# Patient Record
Sex: Male | Born: 1965 | Race: White | Hispanic: No | Marital: Single | Smoking: Never smoker
Health system: Southern US, Community
[De-identification: ages and names within clinical notes are randomized; demographics above are authoritative.]

## PROBLEM LIST (undated history)

## (undated) DIAGNOSIS — K219 Gastro-esophageal reflux disease without esophagitis: Secondary | ICD-10-CM

## (undated) DIAGNOSIS — Z87442 Personal history of urinary calculi: Secondary | ICD-10-CM

## (undated) DIAGNOSIS — M1990 Unspecified osteoarthritis, unspecified site: Secondary | ICD-10-CM

---

## 2002-01-21 HISTORY — PX: ACHILLES TENDON SURGERY: SHX542

## 2009-01-21 HISTORY — PX: CYST EXCISION: SHX5701

## 2016-02-16 ENCOUNTER — Ambulatory Visit: Payer: Self-pay | Admitting: Physician Assistant

## 2016-02-27 ENCOUNTER — Encounter (HOSPITAL_COMMUNITY)
Admission: RE | Admit: 2016-02-27 | Discharge: 2016-02-27 | Disposition: A | Discharge status: Home / Self Care | Payer: BLUE CROSS/BLUE SHIELD | Source: Ambulatory Visit | Attending: Orthopedic Surgery | Admitting: Orthopedic Surgery

## 2016-02-27 ENCOUNTER — Encounter (HOSPITAL_COMMUNITY): Payer: Self-pay

## 2016-02-27 DIAGNOSIS — G9589 Other specified diseases of spinal cord: Secondary | ICD-10-CM | POA: Insufficient documentation

## 2016-02-27 DIAGNOSIS — Z01812 Encounter for preprocedural laboratory examination: Secondary | ICD-10-CM | POA: Insufficient documentation

## 2016-02-27 HISTORY — DX: Personal history of urinary calculi: Z87.442

## 2016-02-27 HISTORY — DX: Gastro-esophageal reflux disease without esophagitis: K21.9

## 2016-02-27 HISTORY — DX: Unspecified osteoarthritis, unspecified site: M19.90

## 2016-02-27 LAB — BASIC METABOLIC PANEL
Anion gap: 8 (ref 5–15)
BUN: 13 mg/dL (ref 6–20)
CO2: 28 mmol/L (ref 22–32)
CREATININE: 0.84 mg/dL (ref 0.61–1.24)
Calcium: 9.5 mg/dL (ref 8.9–10.3)
Chloride: 103 mmol/L (ref 101–111)
GFR calc Af Amer: >60 mL/min (ref >60)
Glucose, Bld: 98 mg/dL (ref 65–99)
Potassium: 3.8 mmol/L (ref 3.5–5.1)
SODIUM: 139 mmol/L (ref 135–145)

## 2016-02-27 LAB — TYPE AND SCREEN
ABO/RH(D): O POS
ANTIBODY SCREEN: NEGATIVE

## 2016-02-27 LAB — CBC
HCT: 48.1 % (ref 39.0–52.0)
Hemoglobin: 16.8 g/dL (ref 13.0–17.0)
MCH: 32.2 pg (ref 26.0–34.0)
MCHC: 34.9 g/dL (ref 30.0–36.0)
MCV: 92.1 fL (ref 78.0–100.0)
PLATELETS: 197 10*3/uL (ref 150–400)
RBC: 5.22 MIL/uL (ref 4.22–5.81)
RDW: 13.3 % (ref 11.5–15.5)
WBC: 8.6 10*3/uL (ref 4.0–10.5)

## 2016-02-27 LAB — ABO/RH: ABO/RH(D): O POS

## 2016-02-27 LAB — SURGICAL PCR SCREEN
MRSA, PCR: NEGATIVE
STAPHYLOCOCCUS AUREUS: NEGATIVE

## 2016-02-27 NOTE — Progress Notes (Addendum)
PCP: Dr. Joline Salt in [place], [place] [phone]  Cardiology: [facility] @ [address] -Riesa Pope --will request ekg/notes/echo (pt. Stated he had to get clearance per Dr. Shon Baton)  Clearance in chart.  Pt. Reports he takes hctz  To prevent kidney stones.

## 2016-02-27 NOTE — Pre-Procedure Instructions (Addendum)
    Rickey Stafford  02/27/2016      [facility] - [place], [place] - [address] [place] [address] Phone: [phone] Fax: [phone]    Your procedure is scheduled on Wed. Feb. 14  Report to [facility] Admitting at 11:15 A.M.  Call this number if you have problems the morning of surgery:  [phone]   Remember:  Do not eat food or drink liquids after midnight on Tues. Feb. 13   Take these medicines the morning of surgery with A SIP OF WATER : tylenol if needed, ranitidine              1 week prior to surgery stop: advil, motrin, ibuprofen,aleve(naproxen sodium),  Mobic, vitamins/herbal medicines.   Do not wear jewelry.  Do not wear lotions, powders, or perfumes, or deoderant.  Do not shave 48 hours prior to surgery.  Men may shave face and neck.  Do not bring valuables to the hospital.  [facility] is not responsible for any belongings or valuables.  Contacts, dentures or bridgework may not be worn into surgery.  Leave your suitcase in the car.  After surgery it may be brought to your room.  For patients admitted to the hospital, discharge time will be determined by your treatment team.  Patients discharged the day of surgery will not be allowed to drive home.    Special instructions:  Review preparing for surgery  Please read over the following fact sheets that you were given. Coughing and Deep Breathing and MRSA Information

## 2016-02-28 NOTE — Progress Notes (Signed)
Anesthesia Chart Review:  Pt is a 51 year old male scheduled for T11-12 decompression and fusion on 03/06/2016 with Venita Lick, MD  - PCP is Joline Salt, MD - Pt saw Arnette Felts, PA, at [facility] for pre-op cardiology eval. Echo results below. Pt cleared at last office visit 02/16/16.   PMH includes:  GERD. Never smoker. BMI 35.5  Medications include: hctz, zantac, sildenafil  Preoperative labs reviewed.    EKG: 02/03/16 [facility]): sinus rhythm with sinus arrhythmia.    Echo 02/16/16 [facility]):  1. LA mildly dilated. 2. Otherwise normal study. Normal cardiac chamber sizes and function. Normal valve anatomy and function.  If no changes, I anticipate pt can proceed with surgery as scheduled.   Rica Mast, FNP-BC [facility]/Anesthesiology Phone: [phone] 02/28/2016 11:13 AM

## 2016-03-06 ENCOUNTER — Encounter (HOSPITAL_COMMUNITY)
Admission: RE | Disposition: A | Discharge status: Home / Self Care | Payer: Self-pay | Source: Ambulatory Visit | Attending: Orthopedic Surgery

## 2016-03-06 ENCOUNTER — Inpatient Hospital Stay (HOSPITAL_COMMUNITY): Payer: BLUE CROSS/BLUE SHIELD

## 2016-03-06 ENCOUNTER — Inpatient Hospital Stay (HOSPITAL_COMMUNITY)
Admission: RE | Admit: 2016-03-06 | Discharge: 2016-03-07 | DRG: 460 | Disposition: A | Discharge status: Home / Self Care | Payer: BLUE CROSS/BLUE SHIELD | Source: Ambulatory Visit | Attending: Orthopedic Surgery | Admitting: Orthopedic Surgery

## 2016-03-06 ENCOUNTER — Inpatient Hospital Stay (HOSPITAL_COMMUNITY): Payer: BLUE CROSS/BLUE SHIELD | Admitting: Certified Registered Nurse Anesthetist

## 2016-03-06 ENCOUNTER — Encounter (HOSPITAL_COMMUNITY): Payer: Self-pay | Admitting: *Deleted

## 2016-03-06 ENCOUNTER — Inpatient Hospital Stay (HOSPITAL_COMMUNITY): Payer: BLUE CROSS/BLUE SHIELD | Admitting: Emergency Medicine

## 2016-03-06 DIAGNOSIS — K219 Gastro-esophageal reflux disease without esophagitis: Secondary | ICD-10-CM | POA: Diagnosis present

## 2016-03-06 DIAGNOSIS — Z791 Long term (current) use of non-steroidal anti-inflammatories (NSAID): Secondary | ICD-10-CM | POA: Diagnosis not present

## 2016-03-06 DIAGNOSIS — Z419 Encounter for procedure for purposes other than remedying health state, unspecified: Secondary | ICD-10-CM

## 2016-03-06 DIAGNOSIS — M5104 Intervertebral disc disorders with myelopathy, thoracic region: Secondary | ICD-10-CM | POA: Diagnosis present

## 2016-03-06 DIAGNOSIS — M5136 Other intervertebral disc degeneration, lumbar region: Secondary | ICD-10-CM | POA: Diagnosis present

## 2016-03-06 DIAGNOSIS — Z79899 Other long term (current) drug therapy: Secondary | ICD-10-CM

## 2016-03-06 DIAGNOSIS — M48062 Spinal stenosis, lumbar region with neurogenic claudication: Secondary | ICD-10-CM | POA: Diagnosis present

## 2016-03-06 DIAGNOSIS — M069 Rheumatoid arthritis, unspecified: Secondary | ICD-10-CM | POA: Diagnosis present

## 2016-03-06 DIAGNOSIS — G959 Disease of spinal cord, unspecified: Secondary | ICD-10-CM | POA: Diagnosis present

## 2016-03-06 HISTORY — PX: LUMBAR LAMINECTOMY/DECOMPRESSION MICRODISCECTOMY: SHX5026

## 2016-03-06 SURGERY — LUMBAR LAMINECTOMY/DECOMPRESSION MICRODISCECTOMY 1 LEVEL
Anesthesia: General

## 2016-03-06 MED ORDER — ACETAMINOPHEN 650 MG RE SUPP: 650.0000 mg | RECTAL | Status: DC | PRN

## 2016-03-06 MED ORDER — ACETAMINOPHEN 10 MG/ML IV SOLN
1000.0000 mg | Freq: Four times a day (QID) | INTRAVENOUS | Status: DC
  Administered 2016-03-06 – 2016-03-07 (×3): 1000 mg via INTRAVENOUS
  Filled 2016-03-06 (×2): qty 100

## 2016-03-06 MED ORDER — HYDROCHLOROTHIAZIDE 25 MG PO TABS
25.0000 mg | ORAL_TABLET | Freq: Every day | ORAL | Status: DC
  Administered 2016-03-07: 25 mg via ORAL
  Filled 2016-03-06 (×2): qty 1

## 2016-03-06 MED ORDER — FENTANYL CITRATE (PF) 100 MCG/2ML IJ SOLN
INTRAMUSCULAR | Status: AC
  Filled 2016-03-06: qty 2

## 2016-03-06 MED ORDER — FLEET ENEMA 7-19 GM/118ML RE ENEM: 1.0000 | ENEMA | Freq: Once | RECTAL | Status: DC

## 2016-03-06 MED ORDER — THROMBIN 20000 UNITS EX SOLR
CUTANEOUS | Status: DC | PRN
  Administered 2016-03-06: 20 mL via TOPICAL

## 2016-03-06 MED ORDER — METHOCARBAMOL 500 MG PO TABS
500.0000 mg | ORAL_TABLET | Freq: Four times a day (QID) | ORAL | Status: DC | PRN
  Administered 2016-03-06 – 2016-03-07 (×2): 500 mg via ORAL
  Filled 2016-03-06 (×2): qty 1

## 2016-03-06 MED ORDER — SUCCINYLCHOLINE CHLORIDE 20 MG/ML IJ SOLN
INTRAMUSCULAR | Status: DC | PRN
  Administered 2016-03-06: 120 mg via INTRAVENOUS

## 2016-03-06 MED ORDER — DEXAMETHASONE SODIUM PHOSPHATE 4 MG/ML IJ SOLN: 4.0000 mg | Freq: Four times a day (QID) | INTRAMUSCULAR | Status: AC

## 2016-03-06 MED ORDER — THROMBIN 20000 UNITS EX SOLR
CUTANEOUS | Status: AC
  Filled 2016-03-06: qty 20000

## 2016-03-06 MED ORDER — LIDOCAINE 2% (20 MG/ML) 5 ML SYRINGE
INTRAMUSCULAR | Status: AC
  Filled 2016-03-06: qty 5

## 2016-03-06 MED ORDER — PROPOFOL 1000 MG/100ML IV EMUL
INTRAVENOUS | Status: AC
  Filled 2016-03-06: qty 100

## 2016-03-06 MED ORDER — MAGNESIUM CITRATE PO SOLN
0.5000 | Freq: Once | ORAL | Status: AC
  Administered 2016-03-07: 0.5 via ORAL

## 2016-03-06 MED ORDER — FENTANYL CITRATE (PF) 100 MCG/2ML IJ SOLN
INTRAMUSCULAR | Status: AC
Start: 2016-03-06 — End: 2016-03-06
  Administered 2016-03-06: 50 ug via INTRAVENOUS
  Filled 2016-03-06: qty 2

## 2016-03-06 MED ORDER — OXYCODONE HCL 5 MG PO TABS
5.0000 mg | ORAL_TABLET | ORAL | Status: DC | PRN
Start: 2016-03-06 — End: 2016-03-07
  Administered 2016-03-07 (×3): 10 mg via ORAL
  Filled 2016-03-06 (×3): qty 2

## 2016-03-06 MED ORDER — PROPOFOL 10 MG/ML IV BOLUS
INTRAVENOUS | Status: AC
  Filled 2016-03-06: qty 20

## 2016-03-06 MED ORDER — ONDANSETRON HCL 4 MG/2ML IJ SOLN: 4.0000 mg | Freq: Once | INTRAMUSCULAR | Status: DC | PRN

## 2016-03-06 MED ORDER — OXYCODONE-ACETAMINOPHEN 5-325 MG PO TABS
1.0000 | ORAL_TABLET | ORAL | Status: DC | PRN
  Administered 2016-03-06: 2 via ORAL
  Filled 2016-03-06: qty 2

## 2016-03-06 MED ORDER — OXYCODONE HCL 5 MG PO TABS
5.0000 mg | ORAL_TABLET | Freq: Once | ORAL | Status: AC | PRN
  Administered 2016-03-06: 5 mg via ORAL

## 2016-03-06 MED ORDER — POLYETHYLENE GLYCOL 3350 17 G PO PACK: 17.0000 g | PACK | Freq: Every day | ORAL | Status: DC | PRN

## 2016-03-06 MED ORDER — LACTATED RINGERS IV SOLN: INTRAVENOUS | Status: DC

## 2016-03-06 MED ORDER — DEXAMETHASONE 4 MG PO TABS
4.0000 mg | ORAL_TABLET | Freq: Four times a day (QID) | ORAL | Status: AC
  Administered 2016-03-06 – 2016-03-07 (×3): 4 mg via ORAL
  Filled 2016-03-06 (×3): qty 1

## 2016-03-06 MED ORDER — BUPIVACAINE-EPINEPHRINE 0.25% -1:200000 IJ SOLN
INTRAMUSCULAR | Status: DC | PRN
  Administered 2016-03-06: 6 mL

## 2016-03-06 MED ORDER — PROPOFOL 10 MG/ML IV BOLUS
INTRAVENOUS | Status: DC | PRN
  Administered 2016-03-06: 180 mg via INTRAVENOUS

## 2016-03-06 MED ORDER — OXYCODONE HCL 5 MG PO TABS
ORAL_TABLET | ORAL | Status: AC
  Administered 2016-03-06: 5 mg via ORAL
  Filled 2016-03-06: qty 1

## 2016-03-06 MED ORDER — MIDAZOLAM HCL 2 MG/2ML IJ SOLN
INTRAMUSCULAR | Status: AC
  Filled 2016-03-06: qty 2

## 2016-03-06 MED ORDER — ONDANSETRON HCL 4 MG/2ML IJ SOLN: 4.0000 mg | INTRAMUSCULAR | Status: DC | PRN

## 2016-03-06 MED ORDER — ONDANSETRON HCL 4 MG/2ML IJ SOLN
INTRAMUSCULAR | Status: DC | PRN
  Administered 2016-03-06: 4 mg via INTRAVENOUS

## 2016-03-06 MED ORDER — ACETAMINOPHEN 10 MG/ML IV SOLN
INTRAVENOUS | Status: AC
  Administered 2016-03-06: 1000 mg via INTRAVENOUS
  Filled 2016-03-06: qty 100

## 2016-03-06 MED ORDER — LACTATED RINGERS IV SOLN
INTRAVENOUS | Status: DC
  Administered 2016-03-06 (×3): via INTRAVENOUS

## 2016-03-06 MED ORDER — MENTHOL 3 MG MT LOZG: 1.0000 | LOZENGE | OROMUCOSAL | Status: DC | PRN

## 2016-03-06 MED ORDER — CEFAZOLIN SODIUM-DEXTROSE 2-4 GM/100ML-% IV SOLN
2.0000 g | Freq: Three times a day (TID) | INTRAVENOUS | Status: AC
  Administered 2016-03-06 – 2016-03-07 (×2): 2 g via INTRAVENOUS
  Filled 2016-03-06 (×2): qty 100

## 2016-03-06 MED ORDER — MIDAZOLAM HCL 2 MG/2ML IJ SOLN
INTRAMUSCULAR | Status: DC | PRN
  Administered 2016-03-06: 2 mg via INTRAVENOUS

## 2016-03-06 MED ORDER — MORPHINE SULFATE (PF) 4 MG/ML IV SOLN: 2.0000 mg | INTRAVENOUS | Status: DC | PRN

## 2016-03-06 MED ORDER — ACETAMINOPHEN 325 MG PO TABS: 650.0000 mg | ORAL_TABLET | ORAL | Status: DC | PRN

## 2016-03-06 MED ORDER — SODIUM CHLORIDE 0.9% FLUSH: 3.0000 mL | Freq: Two times a day (BID) | INTRAVENOUS | Status: DC

## 2016-03-06 MED ORDER — PROPOFOL 500 MG/50ML IV EMUL
INTRAVENOUS | Status: DC | PRN
  Administered 2016-03-06: 100 ug/kg/min via INTRAVENOUS

## 2016-03-06 MED ORDER — SODIUM CHLORIDE 0.9% FLUSH: 3.0000 mL | INTRAVENOUS | Status: DC | PRN

## 2016-03-06 MED ORDER — FENTANYL CITRATE (PF) 100 MCG/2ML IJ SOLN
25.0000 ug | INTRAMUSCULAR | Status: DC | PRN
  Administered 2016-03-06 (×2): 50 ug via INTRAVENOUS

## 2016-03-06 MED ORDER — 0.9 % SODIUM CHLORIDE (POUR BTL) OPTIME
TOPICAL | Status: DC | PRN
  Administered 2016-03-06: 1000 mL

## 2016-03-06 MED ORDER — PHENOL 1.4 % MT LIQD: 1.0000 | OROMUCOSAL | Status: DC | PRN

## 2016-03-06 MED ORDER — CEFAZOLIN SODIUM-DEXTROSE 2-4 GM/100ML-% IV SOLN
2.0000 g | INTRAVENOUS | Status: AC
  Administered 2016-03-06: 2 g via INTRAVENOUS
  Filled 2016-03-06: qty 100

## 2016-03-06 MED ORDER — LIDOCAINE 2% (20 MG/ML) 5 ML SYRINGE
INTRAMUSCULAR | Status: DC | PRN
  Administered 2016-03-06: 40 mg via INTRAVENOUS

## 2016-03-06 MED ORDER — EPINEPHRINE PF 1 MG/ML IJ SOLN
INTRAMUSCULAR | Status: AC
  Filled 2016-03-06: qty 1

## 2016-03-06 MED ORDER — MAGNESIUM CITRATE PO SOLN
0.5000 | Freq: Once | ORAL | Status: DC | PRN
  Filled 2016-03-06: qty 296

## 2016-03-06 MED ORDER — BUPIVACAINE HCL (PF) 0.25 % IJ SOLN
INTRAMUSCULAR | Status: AC
  Filled 2016-03-06: qty 30

## 2016-03-06 MED ORDER — SUCCINYLCHOLINE CHLORIDE 200 MG/10ML IV SOSY
PREFILLED_SYRINGE | INTRAVENOUS | Status: AC
  Filled 2016-03-06: qty 10

## 2016-03-06 MED ORDER — FENTANYL CITRATE (PF) 100 MCG/2ML IJ SOLN
INTRAMUSCULAR | Status: DC | PRN
  Administered 2016-03-06: 50 ug via INTRAVENOUS
  Administered 2016-03-06 (×2): 100 ug via INTRAVENOUS
  Administered 2016-03-06: 50 ug via INTRAVENOUS
  Administered 2016-03-06: 100 ug via INTRAVENOUS

## 2016-03-06 MED ORDER — OXYCODONE HCL 5 MG/5ML PO SOLN: 5.0000 mg | Freq: Once | ORAL | Status: AC | PRN

## 2016-03-06 SURGICAL SUPPLY — 79 items
BUR EGG ELITE 4.0 (BURR) IMPLANT
BUR EGG ELITE 4.0MM (BURR)
BUR MATCHSTICK NEURO 3.0 LAGG (BURR) ×3 IMPLANT
BUR ROUND PRECISION 4.0 (BURR) ×2 IMPLANT
BUR ROUND PRECISION 4.0MM (BURR) ×1
CANISTER SUCTION 2500CC (MISCELLANEOUS) ×3 IMPLANT
CLOSURE STERI-STRIP 1/2X4 (GAUZE/BANDAGES/DRESSINGS) ×1
CLOSURE WOUND 1/2 X4 (GAUZE/BANDAGES/DRESSINGS) ×1
CLSR STERI-STRIP ANTIMIC 1/2X4 (GAUZE/BANDAGES/DRESSINGS) ×2 IMPLANT
CORDS BIPOLAR (ELECTRODE) ×3 IMPLANT
COVER SURGICAL LIGHT HANDLE (MISCELLANEOUS) ×3 IMPLANT
DRAIN CHANNEL 15F RND FF W/TCR (WOUND CARE) IMPLANT
DRAPE POUCH INSTRU U-SHP 10X18 (DRAPES) ×3 IMPLANT
DRAPE SURG 17X23 STRL (DRAPES) ×3 IMPLANT
DRAPE U-SHAPE 47X51 STRL (DRAPES) ×3 IMPLANT
DRESSING AQUACEL AG SP 3.5X6 (GAUZE/BANDAGES/DRESSINGS) ×1 IMPLANT
DRSG AQUACEL AG ADV 3.5X 4 (GAUZE/BANDAGES/DRESSINGS) ×3 IMPLANT
DRSG AQUACEL AG ADV 3.5X 6 (GAUZE/BANDAGES/DRESSINGS) ×3 IMPLANT
DRSG AQUACEL AG SP 3.5X6 (GAUZE/BANDAGES/DRESSINGS) ×3
DURAPREP 26ML APPLICATOR (WOUND CARE) ×3 IMPLANT
ELECT BLADE 4.0 EZ CLEAN MEGAD (MISCELLANEOUS)
ELECT CAUTERY BLADE 6.4 (BLADE) ×3 IMPLANT
ELECT PENCIL ROCKER SW 15FT (MISCELLANEOUS) ×3 IMPLANT
ELECT REM PT RETURN 9FT ADLT (ELECTROSURGICAL) ×3
ELECTRODE BLDE 4.0 EZ CLN MEGD (MISCELLANEOUS) IMPLANT
ELECTRODE REM PT RTRN 9FT ADLT (ELECTROSURGICAL) ×1 IMPLANT
EVACUATOR SILICONE 100CC (DRAIN) IMPLANT
FEE INTRAOP MONITOR IMPULS NCS (MISCELLANEOUS) ×1 IMPLANT
GLOVE BIO SURGEON STRL SZ 6.5 (GLOVE) ×2 IMPLANT
GLOVE BIO SURGEONS STRL SZ 6.5 (GLOVE) ×1
GLOVE BIOGEL PI IND STRL 6.5 (GLOVE) ×1 IMPLANT
GLOVE BIOGEL PI IND STRL 8.5 (GLOVE) ×1 IMPLANT
GLOVE BIOGEL PI INDICATOR 6.5 (GLOVE) ×2
GLOVE BIOGEL PI INDICATOR 8.5 (GLOVE) ×2
GLOVE SS BIOGEL STRL SZ 8.5 (GLOVE) ×1 IMPLANT
GLOVE SUPERSENSE BIOGEL SZ 8.5 (GLOVE) ×2
GOWN STRL REUS W/TWL 2XL LVL3 (GOWN DISPOSABLE) ×6 IMPLANT
INTRAOP MONITOR FEE IMPULS NCS (MISCELLANEOUS) ×1
INTRAOP MONITOR FEE IMPULSE (MISCELLANEOUS) ×2
KIT BASIN OR (CUSTOM PROCEDURE TRAY) ×3 IMPLANT
KIT ROOM TURNOVER OR (KITS) ×3 IMPLANT
NEEDLE 22X1 1/2 (OR ONLY) (NEEDLE) ×3 IMPLANT
NEEDLE SPNL 18GX3.5 QUINCKE PK (NEEDLE) ×6 IMPLANT
NS IRRIG 1000ML POUR BTL (IV SOLUTION) ×3 IMPLANT
PACK LAMINECTOMY ORTHO (CUSTOM PROCEDURE TRAY) ×3 IMPLANT
PACK UNIVERSAL I (CUSTOM PROCEDURE TRAY) ×3 IMPLANT
PAD ARMBOARD 7.5X6 YLW CONV (MISCELLANEOUS) ×6 IMPLANT
PATTIES SURGICAL .5 X.5 (GAUZE/BANDAGES/DRESSINGS) IMPLANT
PATTIES SURGICAL .5 X1 (DISPOSABLE) ×3 IMPLANT
PROBE PEDCLE PROBE MAGSTM DISP (MISCELLANEOUS) ×3 IMPLANT
PUTTY BONE DBX 2.5 MIS (Bone Implant) ×3 IMPLANT
REDUCTION EXT RELINE MAS MOD (Neuro Prosthesis/Implant) ×12 IMPLANT
ROD RELINE MAS LORD 5.5X45MM (Rod) ×6 IMPLANT
SCREW LOCK RELINE 5.5 TULIP (Screw) ×12 IMPLANT
SCREW SHANK RELINE MOD 5.5X45 (Screw) ×12 IMPLANT
SPONGE SURGIFOAM ABS GEL 100 (HEMOSTASIS) IMPLANT
STRIP CLOSURE SKIN 1/2X4 (GAUZE/BANDAGES/DRESSINGS) ×2 IMPLANT
SURGIFLO W/THROMBIN 8M KIT (HEMOSTASIS) ×3 IMPLANT
SUT BONE WAX W31G (SUTURE) ×3 IMPLANT
SUT MON AB 3-0 SH 27 (SUTURE) ×2
SUT MON AB 3-0 SH27 (SUTURE) ×1 IMPLANT
SUT STRATAFIX 1PDS 45CM VIOLET (SUTURE) IMPLANT
SUT STRATAFIX MNCRL+ 3-0 PS-2 (SUTURE)
SUT STRATAFIX MONOCRYL 3-0 (SUTURE)
SUT STRATAFIX SPIRAL + 2-0 (SUTURE) IMPLANT
SUT VIC AB 0 CT1 27 (SUTURE)
SUT VIC AB 0 CT1 27XBRD ANBCTR (SUTURE) IMPLANT
SUT VIC AB 1 CT1 18XCR BRD 8 (SUTURE) ×1 IMPLANT
SUT VIC AB 1 CT1 8-18 (SUTURE) ×2
SUT VIC AB 1 CTX 36 (SUTURE) ×4
SUT VIC AB 1 CTX36XBRD ANBCTR (SUTURE) ×2 IMPLANT
SUT VIC AB 2-0 CT1 18 (SUTURE) ×3 IMPLANT
SUTURE STRATFX MNCRL+ 3-0 PS-2 (SUTURE) IMPLANT
SYR BULB IRRIGATION 50ML (SYRINGE) ×3 IMPLANT
SYR CONTROL 10ML LL (SYRINGE) ×3 IMPLANT
TOWEL OR 17X24 6PK STRL BLUE (TOWEL DISPOSABLE) ×3 IMPLANT
TOWEL OR 17X26 10 PK STRL BLUE (TOWEL DISPOSABLE) ×3 IMPLANT
WATER STERILE IRR 1000ML POUR (IV SOLUTION) ×3 IMPLANT
YANKAUER SUCT BULB TIP NO VENT (SUCTIONS) ×3 IMPLANT

## 2016-03-06 NOTE — Transfer of Care (Signed)
Immediate Anesthesia Transfer of Care Note  Patient: Elver Stadler  Procedure(s) Performed: Procedure(s) with comments: T11-12 Decompression and fusion (N/A) - Requests 4 hours  Patient Location: PACU  Anesthesia Type:General  Level of Consciousness: awake, alert  and patient cooperative  Airway & Oxygen Therapy: Patient Spontanous Breathing and Patient connected to nasal cannula oxygen  Post-op Assessment: Report given to RN, Post -op Vital signs reviewed and stable and Patient moving all extremities  Post vital signs: Reviewed and stable  Last Vitals:  Vitals:   03/06/16 1226 03/06/16 1625  BP: 109/64 115/61  Pulse: 69 72  Resp: 20 15  Temp: 37.2 C 36.8 C    Last Pain:  Vitals:   03/06/16 1226  TempSrc: Oral         Complications: No apparent anesthesia complications

## 2016-03-06 NOTE — Anesthesia Postprocedure Evaluation (Addendum)
Anesthesia Post Note  Patient: Rickey Stafford  Procedure(s) Performed: Procedure(s) (LRB): T11-12 Decompression and fusion (N/A)  Patient location during evaluation: PACU Anesthesia Type: General Level of consciousness: awake, awake and alert and oriented Pain management: pain level controlled Vital Signs Assessment: post-procedure vital signs reviewed and stable Respiratory status: spontaneous breathing, nonlabored ventilation and respiratory function stable Cardiovascular status: blood pressure returned to baseline Anesthetic complications: no       Last Vitals:  Vitals:   03/06/16 1726 03/06/16 1730  BP: 119/83   Pulse:  62  Resp:  15  Temp:      Last Pain:  Vitals:   03/06/16 1730  TempSrc:   PainSc: 6                  Rickey Stafford

## 2016-03-06 NOTE — Brief Op Note (Signed)
03/06/2016  4:00 PM  PATIENT:  Rickey Stafford  51 y.o. 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  PROCEDURE:  Procedure(s) with comments: T11-12 Decompression and fusion (N/A) - Requests 4 hours  SURGEON:  Surgeon(s) and Role:    * Venita Lick, MD - Primary  PHYSICIAN ASSISTANT:   ASSISTANTS: Carmen Mayo   ANESTHESIA:   general  EBL:  Total I/O In: 2000 [I.V.:2000] Out: 495 [Urine:195; Blood:300]  BLOOD ADMINISTERED:none  DRAINS: none   LOCAL MEDICATIONS USED:  MARCAINE     SPECIMEN:  No Specimen  DISPOSITION OF SPECIMEN:  N/A  COUNTS:  YES  TOURNIQUET:  * No tourniquets in log *  DICTATION: .Other Dictation: Dictation Number [id number]  PLAN OF CARE: Admit to inpatient   PATIENT DISPOSITION:  PACU - hemodynamically stable.

## 2016-03-06 NOTE — Anesthesia Preprocedure Evaluation (Addendum)

## 2016-03-06 NOTE — H&P (Signed)
History of Present Illness  The patient is a 50 year old male who presents for a follow-up for H & P. The patient is scheduled for a T11-12 decompression and fusion to be performed by Dr. Debria Garret D. Shon Baton, MD at [facility] on 03-06-16 . Please see the hospital record for complete dictated history and physical. Pt reports a hx of good health.  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 Diabetes Mellitus  Paternal Grandfather. Hypertension  Maternal Grandmother. Osteoarthritis  Mother. Rheumatoid Arthritis  Mother. Severe allergy  Mother.  Social History  Tobacco use  Never smoker. 11/20/2015: uses 1 1/2 can(s) smokeless per week Tobacco / smoke exposure  11/20/2015: no Children  0 Current work status  working full time Exercise  Exercises weekly; does running / walking Living situation  live alone Marital status  single Never consumed alcohol  11/20/2015: Never consumed alcohol No history of drug/alcohol rehab  Not under pain contract  Number of flights of stairs before winded  4-5  Medication History HydroCHLOROthiazide (25MG  Tablet, Oral) Active. RaNITidine HCl (300MG  Tablet, Oral) Active. Adderall XR (20MG  Capsule ER 24HR, Oral) Active. Meloxicam (15MG  Tablet, Oral) Active. Medications Reconciled  Past Surgical History Tonsillectomy   Other Problems  Gastroesophageal Reflux Disease  Rheumatoid Arthritis  Kidney Stone   Vitals  02/27/2016 9:32 AM Weight: 239 lb Height: 69in Weight was reported by patient. Height was reported by patient. Body Surface Area: 2.23 m Body Mass  Index: 35.29 kg/m  Temp.: 98.21F(Oral)  Pulse: 86 (Regular)  BP: 142/85 (Sitting, Left Arm, Standard)  General General Appearance-Not in acute distress. Orientation-Oriented X3. Build & Nutrition-Well nourished and Well developed.  Integumentary General Characteristics Surgical Scars - no surgical scar evidence of previous lumbar surgery. Lumbar Spine-Skin examination of the lumbar spine is without deformity, skin lesions, lacerations or abrasions.  Chest and Lung Exam Auscultation Breath sounds - Normal and Clear.  Cardiovascular Auscultation Rhythm - Regular rate and rhythm.  Abdomen Palpation/Percussion Palpation and Percussion of the abdomen reveal - Soft, Non Tender and No Rebound tenderness.  Peripheral Vascular Lower Extremity Palpation - Posterior tibial pulse - Bilateral - 2+. Dorsalis pedis pulse - Bilateral - 2+.  Neurologic Sensation Lower Extremity - Left - sensation is diminished in the lower extremity. Right - sensation is intact in the lower extremity. Reflexes Patellar Reflex - Bilateral - 2+. Achilles Reflex - Bilateral - 2+. Clonus - Bilateral - clonus not present. Hoffman's Sign - Bilateral - Hoffman's sign not present. Testing Seated Straight Leg Raise - Bilateral - Seated straight leg raise negative. Note: Unequivocal Babinski, No clonus   Musculoskeletal Spine/Ribs/Pelvis  Lumbosacral Spine: Inspection and Palpation - Tenderness - no soft tissue tenderness to palpation and no bony tenderness to palpation, bony and soft tissue palpation of the lumbar spine and SI joint does not recreate their typical pain. Strength and Tone: Strength - Hip Flexion - Bilateral - 5/5. Knee Extension - Bilateral - 5/5. Knee Flexion - Bilateral - 5/5. Ankle Dorsiflexion - Bilateral - 5/5. Ankle Plantarflexion - Bilateral - 5/5. Heel walk - Bilateral - able to heel walk without difficulty. Toe Walk - Bilateral - able to walk on toes without difficulty.  Heel-Toe Walk - Bilateral - able  to heel-toe walk without difficulty. ROM - Flexion - full range of motion. Extension - full range of motion. Left Lateral Bending - full range of motion. Right Lateral Bending - full range of motion. Right Rotation - full range of motion. Left Rotation - full range of motion. Pain - neither flexion or extension is more painful than the other. Lumbosacral Spine - Waddell's Signs - no Waddell's signs present. Lower Extremity Range of Motion - No true hip, knee or ankle pain with range of motion. Gait and Station - [employer] - no assistive devices.  His MRI shows myelopathic findings with cord signal change at T11-T12. I do agree with the natural history of myelopathy as one of progression  Assessment & Plan Goal Of Surgery: Discussed that goal of surgery is to reduce pain and improve function and quality of life. Patient is aware that despite all appropriate treatment that there pain and function could be the same, worse, or different.  Risks: infection, bleeding, nerve damage, death, stroke, paralysis, non-union, need for additional surgery, ongoing or worse pain/impairment, hardware failure, blood clots.  At this point, his MRI clearly shows myelopathic findings with cord signal change at T11-12. I do agree that the natural history of myelopathy is one of progression. In order to avoid this, I have recommended that we move forward with a T11-12 posterior decompression and then an instrumented fusion most likely from T11 to T12, but perhaps T10 to T12 depending upon the extent of the decompression that is required. I do not favor doing a rib resection and going anterior to remove that disc fragment. I think by doing a straight extensive posterior decompression this will allow enough room for the spinal cord that the myelopathy will not progress. The stabilization will also minimize ongoing degenerative changes as it will fuse that level. I have reviewed the risks  with him, which include infection, bleeding, nerve damage, death, stroke, paralysis, ongoing or worse pain, leak of spinal fluid, and need for additional surgery. All of his questions were addressed. We will plan on getting preoperative medical clearance from his primary care physician, Dr. Joline Salt in [facility].  .

## 2016-03-06 NOTE — Anesthesia Procedure Notes (Signed)
Procedure Name: Intubation Date/Time: 03/06/2016 12:44 PM Performed by: Julieta Bellini Pre-anesthesia Checklist: Patient identified, Emergency Drugs available, Patient being monitored, Suction available and Timeout performed Patient Re-evaluated:Patient Re-evaluated prior to inductionOxygen Delivery Method: Circle system utilized Preoxygenation: Pre-oxygenation with 100% oxygen Intubation Type: IV induction Ventilation: Mask ventilation without difficulty Laryngoscope Size: Mac and 3 Grade View: Grade I Tube type: Oral Number of attempts: 1 Airway Equipment and Method: Stylet Placement Confirmation: ETT inserted through vocal cords under direct vision,  positive ETCO2 and breath sounds checked- equal and bilateral Secured at: 22 cm Tube secured with: Tape Dental Injury: Teeth and Oropharynx as per pre-operative assessment

## 2016-03-07 ENCOUNTER — Encounter (HOSPITAL_COMMUNITY): Payer: Self-pay | Admitting: Orthopedic Surgery

## 2016-03-07 MED ORDER — ONDANSETRON 4 MG PO TBDP: 4.0000 mg | ORAL_TABLET | Freq: Three times a day (TID) | ORAL | 0 refills | Status: DC | PRN

## 2016-03-07 MED ORDER — OXYCODONE-ACETAMINOPHEN 10-325 MG PO TABS: 1.0000 | ORAL_TABLET | ORAL | 0 refills | Status: DC | PRN

## 2016-03-07 MED ORDER — METHOCARBAMOL 500 MG PO TABS: 500.0000 mg | ORAL_TABLET | Freq: Four times a day (QID) | ORAL | 0 refills | Status: DC | PRN

## 2016-03-07 MED ORDER — LUBIPROSTONE 24 MCG PO CAPS: 24.0000 ug | ORAL_CAPSULE | Freq: Two times a day (BID) | ORAL | 0 refills | Status: DC

## 2016-03-07 NOTE — Progress Notes (Signed)
Pt doing well. Pt and sister given D/C instructions with Rx's, verbal understanding was provided. Pt's incision is clean and dry with no sign of infection. Pt's IV was removed prior to D/C. Pt received 3-n-1 from [facility] prior to D/C. Pt D/C'd home via wheelchair with TLSO @ 1205 per MD order. Pt is stable @ D/C and has no other needs at this time. Rema Fendt, RN

## 2016-03-07 NOTE — Op Note (Signed)
NAMEDONTEL, HARSHBERGER             ACCOUNT NO.:  [id number]  MEDICAL RECORD NO.:  [id number]  LOCATION:                                 FACILITY:  PHYSICIAN:  Luismanuel Corman D. Shon Baton, M.D.      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  OPERATIVE PROCEDURE:  T11-12 decompression with posterolateral instrumented arthrodesis.  FIRST ASSISTANT:  [place], [place].  COMPLICATIONS:  None.  Neuromonitoring throughout the case including evoked motor and sensory potentials remained normal.  In fact, there was improvement in signals at the conclusion of the case.  There were no aberrant free-running EMG or SSEP or motor evoked potentials.  IMPLANTS USED:  NuVasive pedicle screw 5.5 diameter, 45 mm length with the appropriate-sized rods.  CONDITION:  Stable.  HISTORY:  This is a pleasant 51 year old gentleman who has been having numbness and some difficulty maintaining his balance in his lower extremity.  Imaging studies confirmed thoracic myelopathy with cord signal changes at the T11-12 level.  After discussing treatment options, we elected to proceed with a posterior decompression to alleviate the cord compression and decompress it and then an instrumented fusion to prevent further worsening of the disk herniation.  All appropriate risks, benefits and alternatives were discussed with the patient and consent was obtained.  OPERATIVE NOTE:  The patient was brought into the operating room and placed supine on the operating table.  After successful induction of general anesthesia and endotracheal intubation, TEDs, SCDs and a Foley were inserted.  The knee was turned prone onto the Wilson frame.  All bony prominences were well padded and the back was prepped and draped  in a standard fashion.  Time-out was taken confirming the patient, procedure and all other pertinent important data.  Once this was complete, lateral fluoroscopy was used to count from the L5 level up to the T12 vertebral body.  I then identified the L1-T12 disk space, T11-12 disk space and the T10-T11 disk space.  I then mapped out my incision, infiltrated with 0.25% Marcaine.  Midline incision was made starting at just superior to the T11 pedicle and moving down towards the T12-L1 disk space.  This would provide an adequate level for decompression.  Sharp dissection was carried out down to the deep fascia and deep fascia was sharply incised.  I stripped the paraspinal muscles using Cobb and Bovie to expose the T11 and T12 spinous process, lamina and out to the costovertebral articulation.  I then recounted using lateral and AP fluoro confirming once again the T11 and T12 level.  Once this was done, self-retaining retractors were placed and high-speed bur was used to decorticate the area over the pedicle.  A pedicle probe was then advanced using AP fluoro down to the medial wall of the pedicle.  I then switched to the lateral view to confirm that I was just beyond the posterior vertebral body.  Confirming trajectory and position, I advanced the pedicle probe into the vertebral body.  I then probed the hole  with ball-tip feeler, tapped and then reprobed the hole with a ball- tip feeler and then placed the 5.5 diameter 45-mm length screw.  I repeated this exact same procedure at T11 and on the contralateral side. Once all four screws were placed, I confirmed satisfactory position with both AP and lateral fluoroscopy.  At this point, with the instrumentation complete, I then proceeded with the decompression.  The posterior elements of T11 and T12 were removed with double-action Leksell rongeur.  Curved curette was used to create a plane underneath the lamina of T11 and then I used my 2 and  3-mm Kerrison punch to perform a very generous laminotomy of T11.  Once I had reached the insertion site of the ligamentum flavum, I released the ligamentum flavum in the midline and using my Penfield 4, dissected into the central raphe.  I then began removing the ligamentum flavum using a 2-mm Kerrison punch.  There was some epidural fat, which I was easily dissected and I could then visualize the posterior thecal sac.  I continued my dissection out laterally until I could gently palpate the pedicle.  At this point, I noted a pedicle-to-pedicle decompression.  I then took an x-ray fluoro view confirming that my decompression spanned from the midportion of the T11 pedicle down to the midportion of the T12 pedicle.  This adequately spanned the area of maximum compression, which was right at the T11-12 disk space itself.  At this point with the posterior decompression complete, I irrigated and then decorticated the posterolateral aspect of the spine.  I then placed a thrombin-soaked Gelfoam patty over the exposed thecal sac and then packed the posterolateral recess with bone graft and then contoured the rods and secured them to the pedicle screw rod construct.  The rods were then locked in place with locking caps and each were torqued off according to manufacture's standards.  At this point, I took final x-rays with fluoro and they were satisfactory.  I also took a final lateral x-ray itself, digital x-ray, which captured from the L5 level all the way up to T10, confirming once again that I was at the T11-12 disk space and that was the area based on the MRI where the compression occurred.  Having confirmed and reconfirmed the appropriate level and hardware position and placement, I then irrigated copiously with normal saline and made sure I had hemostasis using bipolar electrocautery.  The deep wound was then closed in a layered fashion with interrupted #1 Vicryl suture, 2-0 Vicryl suture  and a 3-0 Monocryl.  Steri-Strips and a dry dressing were then applied and the patient was extubated and transferred to the PACU without incident.  At the end of the case, all needle and sponge counts were correct.  There were no adverse intraoperative events.     Daelan Gatt D. Shon Baton, M.D.   ______________________________ Donn Pierini. Shon Baton, M.D.    DDB/MEDQ  D:  03/06/2016  T:  03/06/2016  Job:  [id number]

## 2016-03-07 NOTE — Evaluation (Signed)
Occupational Therapy Evaluation Patient Details Name: Rickey Stafford MRN: [id number] DOB: 1965-10-07 Today's Date: 03/07/2016    History of Present Illness Pt is a 51 y/o male who presents s/p T11-T 12 decompression/fusion on 03/06/16.   Clinical Impression   Pt is at min A level with LB ADLs and Mod I with ADL mobility. Educated pt and his sister (who is a CNA) on DME, A/E, toileting  Aid and safety techniques with review of back precautions. All education completed and no further OT indicated    Follow Up Recommendations  No OT follow up;Supervision - Intermittent    Equipment Recommendations  Other (comment);Toilet rise with handles (ADL A/E kit)    Recommendations for Other Services       Precautions / Restrictions Precautions Precautions: Fall;Back Precaution Booklet Issued: Yes (comment) Precaution Comments: Reviewed in detail. Pt was cued for precautions during functional mobility.  Required Braces or Orthoses: Spinal Brace Spinal Brace: Thoracolumbosacral orthotic;Applied in sitting position Restrictions Weight Bearing Restrictions: No      Mobility Bed Mobility Overal bed mobility: Modified Independent Bed Mobility: Rolling;Sidelying to Sit Rolling: Modified independent (Device/Increase time) Sidelying to sit: Modified independent (Device/Increase time)       General bed mobility comments: Increased time required. HOB slightly elevated however pt has adjustable bed at home. VC's for proper log roll technique.   Transfers Overall transfer level: Modified independent Equipment used: None Transfers: Sit to/from Stand Sit to Stand: Modified independent (Device/Increase time)         General transfer comment: Pt requesting elevated bed height for ease of transfer. Encouraged pt not to rely on increased height however allowed some increase. No assist required.     Balance Overall balance assessment: No apparent balance deficits (not formally assessed)                                           ADL Overall ADL's : Needs assistance/impaired     Grooming: Wash/dry hands;Wash/dry face;Modified independent;Standing   Upper Body Bathing: Set up;With caregiver independent assisting   Lower Body Bathing: Minimal assistance;With caregiver independent assisting   Upper Body Dressing : Set up;With caregiver independent assisting   Lower Body Dressing: Minimal assistance;With caregiver independent assisting   Toilet Transfer: Moderate assistance;Comfort height toilet;Ambulation   Toileting- Clothing Manipulation and Hygiene: Min guard;Sit to/from stand   Tub/ Shower Transfer: Modified independent;Ambulation   Functional mobility during ADLs: Modified independent General ADL Comments: educated pt and his sister on ADL A/E and toileting aids for home use     Vision Vision Assessment?: No apparent visual deficits              Pertinent Vitals/Pain Pain Assessment: 0-10 Pain Score: 2  Faces Pain Scale: Hurts little more Pain Location: back Pain Descriptors / Indicators: Operative site guarding;Discomfort Pain Intervention(s): Monitored during session;Premedicated before session;Repositioned     Hand Dominance Right   Extremity/Trunk Assessment Upper Extremity Assessment Upper Extremity Assessment: Overall WFL for tasks assessed   Lower Extremity Assessment Lower Extremity Assessment: Defer to PT evaluation   Cervical / Trunk Assessment Cervical / Trunk Assessment: Normal   Communication Communication Communication: No difficulties   Cognition Arousal/Alertness: Awake/alert Behavior During Therapy: WFL for tasks assessed/performed Overall Cognitive Status: Within Functional Limits for tasks assessed  General Comments   pt pleasant and cooperative, talkative                 Home Living Family/patient expects to be discharged to:: Private residence Living Arrangements:  Alone Available Help at Discharge: Family Type of Home: Apartment Home Access: Stairs to enter Technical brewer of Steps: 1   [place]: One level     Bathroom Shower/Tub: Occupational psychologist: Handicapped height Bathroom Accessibility: Yes   Home Equipment: None          Prior Functioning/Environment Level of Independence: Independent                 OT Problem List: Pain;Decreased knowledge of use of DME or AE   OT Treatment/Interventions:      OT Goals(Current goals can be found in the care plan section) Acute Rehab OT Goals Patient Stated Goal: go home OT Goal Formulation: With patient/family  OT Frequency:     Barriers to D/C:    no barriers                     End of Session Equipment Utilized During Treatment: Back brace  Activity Tolerance: Patient tolerated treatment well Patient left: in bed (sitting EOB)   Time: 1610-9604 OT Time Calculation (min): 53 min Charges:  OT General Charges $OT Visit: 1 Procedure OT Evaluation $OT Eval Low Complexity: 1 Procedure OT Treatments $Self Care/Home Management : 23-37 mins $Therapeutic Activity: 8-22 mins G-Codes:    Britt Bottom 03/07/2016, 1:59 PM

## 2016-03-07 NOTE — Evaluation (Signed)
Physical Therapy Evaluation Patient Details Name: Rickey Stafford MRN: [id number] DOB: 1965/11/02 Today's Date: 03/07/2016   History of Present Illness  Pt is a 51 y/o male who presents s/p T11-T 12 decompression/fusion on 03/06/16.  Clinical Impression  Patient evaluated by Physical Therapy with no further acute PT needs identified. All education has been completed and the patient has no further questions. At the time of PT eval pt was able to perform transfers and ambulation with gross modified independence.  See below for any follow-up Physical Therapy or equipment needs. PT is signing off. Thank you for this referral.     Follow Up Recommendations Outpatient PT    Equipment Recommendations  3in1 (PT)    Recommendations for Other Services       Precautions / Restrictions Precautions Precautions: Fall;Back Precaution Booklet Issued: Yes (comment) Precaution Comments: Reviewed in detail. Pt was cued for precautions during functional mobility.  Required Braces or Orthoses: Spinal Brace Spinal Brace: Thoracolumbosacral orthotic;Applied in sitting position Restrictions Weight Bearing Restrictions: No      Mobility  Bed Mobility Overal bed mobility: Modified Independent Bed Mobility: Rolling;Sidelying to Sit           General bed mobility comments: Increased time required. HOB slightly elevated however pt has adjustable bed at home. VC's for proper log roll technique.   Transfers Overall transfer level: Modified independent Equipment used: None Transfers: Sit to/from Stand           General transfer comment: Pt requesting elevated bed height for ease of transfer. Encouraged pt not to rely on increased height however allowed some increase. No assist required.   Ambulation/Gait Ambulation/Gait assistance: Modified independent (Device/Increase time) Ambulation Distance (Feet): 400 Feet Assistive device: None Gait Pattern/deviations: Step-through pattern;Decreased  stride length Gait velocity: Decreased Gait velocity interpretation: Below normal speed for age/gender General Gait Details: Pt was able to ambulate in hall slowly but without LOB.   Stairs            Wheelchair Mobility    Modified Rankin (Stroke Patients Only)       Balance Overall balance assessment: No apparent balance deficits (not formally assessed)                                           Pertinent Vitals/Pain Pain Assessment: Faces Faces Pain Scale: Hurts little more Pain Location: Incision site Pain Descriptors / Indicators: Operative site guarding;Discomfort Pain Intervention(s): Limited activity within patient's tolerance;Monitored during session;Repositioned    Home Living Family/patient expects to be discharged to:: Private residence   Available Help at Discharge: Family Type of Home: Apartment Home Access: Stairs to enter   Secretary/administrator of Steps: 1 Home Layout: One level Home Equipment: None      Prior Function Level of Independence: Independent               Hand Dominance   Dominant Hand: Right    Extremity/Trunk Assessment   Upper Extremity Assessment Upper Extremity Assessment: Overall WFL for tasks assessed    Lower Extremity Assessment Lower Extremity Assessment: Generalized weakness       Communication   Communication: No difficulties  Cognition Arousal/Alertness: Awake/alert Behavior During Therapy: WFL for tasks assessed/performed Overall Cognitive Status: Within Functional Limits for tasks assessed                      General  Comments      Exercises     Assessment/Plan    PT Assessment Patent does not need any further PT services  PT Problem List            PT Treatment Interventions      PT Goals (Current goals can be found in the Care Plan section)  Acute Rehab PT Goals PT Goal Formulation: All assessment and education complete, DC therapy    Frequency      Barriers to discharge        Co-evaluation               End of Session Equipment Utilized During Treatment: Gait belt;Back brace Activity Tolerance: Patient limited by fatigue Patient left: in bed;with call bell/phone within reach;with family/visitor present (Sitting EOB waiting for RN to change dressing. ) Nurse Communication: Mobility status         Time: 7262-0355 PT Time Calculation (min) (ACUTE ONLY): 32 min   Charges:   PT Evaluation $PT Eval Moderate Complexity: 1 Procedure PT Treatments $Gait Training: 8-22 mins   PT G Codes:        Marylynn Pearson 03/23/16, 11:40 AM  Conni Slipper, PT, DPT Acute Rehabilitation Services Pager: [phone]

## 2016-03-07 NOTE — Progress Notes (Signed)
    Subjective: 1 Day Post-Op Procedure(s) (LRB): T11-12 Decompression and fusion (N/A) Patient reports pain as 3 on 0-10 scale.  Incisional pain Denies CP or SOB.  Voiding without difficulty. Positive flatus.Pt walking in hallway Objective: Vital signs in last 24 hours: Temp:  [98.3 F (36.8 C)-99 F (37.2 C)] 98.5 F (36.9 C) (02/15 0400) Pulse Rate:  [56-90] 72 (02/15 0400) Resp:  [11-23] 20 (02/15 0400) BP: (103-129)/(58-100) 104/70 (02/15 0400) SpO2:  [96 %-100 %] 97 % (02/15 0400) Weight:  [109.3 kg (241 lb)] 109.3 kg (241 lb) (02/14 1226)  Intake/Output from previous day: 02/14 0701 - 02/15 0700 In: 2200 [I.V.:2200] Out: 2120 [Urine:1820; Blood:300] Intake/Output this shift: No intake/output data recorded.  Labs: No results for input(s): HGB in the last 72 hours. No results for input(s): WBC, RBC, HCT, PLT in the last 72 hours. No results for input(s): NA, K, CL, CO2, BUN, CREATININE, GLUCOSE, CALCIUM in the last 72 hours. No results for input(s): LABPT, INR in the last 72 hours.  Physical Exam: Neurologically intact ABD soft Sensation intact distally Dorsiflexion/Plantar flexion intact Incision: dressing C/D/I Compartment soft  Assessment/Plan: 1 Day Post-Op Procedure(s) (LRB): T11-12 Decompression and fusion (N/A) Advance diet Up with therapy  Pt may DC home today after cleared by PT  [place], Baxter Kail for Dr. Venita Lick [facility] [phone] 03/07/2016, 7:25 AM    Patient ID: Rickey Stafford, male   DOB: 06-29-1965, 51 y.o.   MRN: [id number]

## 2016-03-26 NOTE — Discharge Summary (Signed)
Physician Discharge Summary  Patient ID: Rickey Stafford MRN: [id number] DOB/AGE: May 19, 1965 51 y.o.  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 Arthritis   . GERD (gastroesophageal reflux disease)   . History of kidney stones     Surgeries: Procedure(s): T11-12 Decompression and fusion on 03/06/2016   Consultants (if any):   Discharged Condition: Improved  Hospital Course: Rickey Stafford is an 51 y.o. 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  Post op day 1 pt is voiding w/o difficulty.  Pt has been walking in hall.  Pt reports incisional pain controlled on oral medication.  Pt cleared by PT/OT before DC.  He was given perioperative antibiotics:  Anti-infectives    Start     Dose/Rate Route Frequency Ordered Stop   03/06/16 2200  ceFAZolin (ANCEF) IVPB 2g/100 mL premix     2 g 200 mL/hr over 30 Minutes Intravenous Every 8 hours 03/06/16 1930 03/07/16 0549   03/06/16 1133  ceFAZolin (ANCEF) IVPB 2g/100 mL premix     2 g 200 mL/hr over 30 Minutes Intravenous 30 min pre-op 03/06/16 1133 03/06/16 1246    .  He was given sequential compression devices, early ambulation, and TED for DVT prophylaxis.  He benefited maximally from the hospital stay and there were no complications.    Recent vital signs:  Vitals:   03/07/16 0400 03/07/16 0822  BP: 104/70 117/75  Pulse: 72 85  Resp: 20 18  Temp: 98.5 F (36.9 C) 98.2 F (36.8 C)    Recent laboratory studies:  Lab Results  Component Value Date   HGB 16.8 02/27/2016   Lab Results  Component Value Date   WBC 8.6 02/27/2016   PLT 197 02/27/2016   No results found for: INR Lab Results  Component Value Date   NA 139  02/27/2016   K 3.8 02/27/2016   CL 103 02/27/2016   CO2 28 02/27/2016   BUN 13 02/27/2016   CREATININE 0.84 02/27/2016   GLUCOSE 98 02/27/2016    Discharge Medications:   Allergies as of 03/07/2016      Reactions   No Known Allergies       Medication List    STOP taking these medications   acetaminophen 500 MG tablet Commonly known as:  TYLENOL   ibuprofen 200 MG tablet Commonly known as:  ADVIL,MOTRIN   naproxen sodium 220 MG tablet Commonly known as:  ANAPROX     TAKE these medications   ADDERALL XR 20 MG 24 hr capsule Generic drug:  amphetamine-dextroamphetamine Take 20 mg by mouth daily.   diphenhydramine-acetaminophen 25-500 MG Tabs tablet Commonly known as:  TYLENOL PM Take 2 tablets by mouth at bedtime as needed (for hives.).   hydrochlorothiazide 25 MG tablet Commonly known as:  HYDRODIURIL Take 25 mg by mouth daily.   lubiprostone 24 MCG capsule Commonly known as:  AMITIZA Take 1 capsule (24 mcg total) by mouth 2 (two) times daily with a meal.   meloxicam 15 MG tablet Commonly known as:  MOBIC Take 15 mg by mouth daily.   methocarbamol 500 MG tablet Commonly known as:  ROBAXIN Take 1 tablet (500 mg total) by mouth every 6 (six) hours as needed for muscle spasms.  ondansetron 4 MG disintegrating tablet Commonly known as:  ZOFRAN ODT Take 1 tablet (4 mg total) by mouth every 8 (eight) hours as needed for nausea or vomiting.   ondansetron 4 MG disintegrating tablet Commonly known as:  ZOFRAN ODT Take 1 tablet (4 mg total) by mouth every 8 (eight) hours as needed for nausea or vomiting.   oxyCODONE-acetaminophen 10-325 MG tablet Commonly known as:  PERCOCET Take 1 tablet by mouth every 4 (four) hours as needed for pain.   ranitidine 300 MG tablet Commonly known as:  ZANTAC Take 300 mg by mouth daily.   sildenafil 20 MG tablet Commonly known as:  REVATIO Take 20-100 mg by mouth daily as needed (for erectile dysfunction). TAKE ONE TO FIVE  TABLETS AS DIRECTED BY PHYSICIAN. DO NOT TAKE MORE THAN 5 TABS IN ANY 24 HOUR PERIOD.       Diagnostic Studies: Dg Thoracolumabar Spine  Result Date: 03/06/2016 CLINICAL DATA:  T11-12 decompression and fusion. EXAM: DG C-ARM 61-120 MIN; THORACOLUMBAR SPINE - 2 VIEW COMPARISON:  Thoracic spine radiographs from earlier today. FINDINGS: Fluoroscopy time 2 minutes 11 seconds. Two nondiagnostic spot fluoroscopic intraoperative radiographs demonstrate postsurgical changes from bilateral posterior spinal fusion at T11-12. IMPRESSION: Intraoperative fluoroscopic guidance for bilateral posterior spinal fusion at T11-12. Electronically Signed   By: Delbert Phenix M.D.   On: 03/06/2016 15:46   Dg Spine Portable 1 View  Result Date: 03/06/2016 CLINICAL DATA:  T11-T12 decompression EXAM: PORTABLE SPINE - 1 VIEW COMPARISON:  None. FINDINGS: Pedicle screws are noted at T11 and T12. The T11 screws appears somewhat in the upper portion of the vertebral body but this may be projectional in nature. Multilevel degenerative change and disc space narrowing is noted. IMPRESSION: Intraoperative localization at T11-12 These findings were called to the OR for the time of exam interpretation. Electronically Signed   By: Alcide Clever M.D.   On: 03/06/2016 15:19   Dg C-arm 61-120 Min  Result Date: 03/06/2016 CLINICAL DATA:  T11-12 decompression and fusion. EXAM: DG C-ARM 61-120 MIN; THORACOLUMBAR SPINE - 2 VIEW COMPARISON:  Thoracic spine radiographs from earlier today. FINDINGS: Fluoroscopy time 2 minutes 11 seconds. Two nondiagnostic spot fluoroscopic intraoperative radiographs demonstrate postsurgical changes from bilateral posterior spinal fusion at T11-12. IMPRESSION: Intraoperative fluoroscopic guidance for bilateral posterior spinal fusion at T11-12. Electronically Signed   By: Delbert Phenix M.D.   On: 03/06/2016 15:46    Disposition: 01-Home or Self Care Pt will present to clinic in 2 weeks Post op medications  provided  Discharge Instructions    Incentive spirometry RT    Complete by:  As directed       Follow-up Information    BROOKS,DAHARI D, MD Follow up in 2 week(s).   Specialty:  Orthopedic Surgery Contact information: [address] [place] [address] [phone]            Signed: Kirt Boys 03/26/2016, 7:49 PM

## 2016-07-25 ENCOUNTER — Other Ambulatory Visit: Payer: Self-pay | Admitting: Orthopedic Surgery

## 2016-07-25 DIAGNOSIS — M48062 Spinal stenosis, lumbar region with neurogenic claudication: Secondary | ICD-10-CM

## 2016-08-05 ENCOUNTER — Ambulatory Visit
Admission: RE | Admit: 2016-08-05 | Discharge: 2016-08-05 | Disposition: A | Discharge status: Home / Self Care | Payer: BLUE CROSS/BLUE SHIELD | Source: Ambulatory Visit | Attending: Orthopedic Surgery | Admitting: Orthopedic Surgery

## 2016-08-05 DIAGNOSIS — M48062 Spinal stenosis, lumbar region with neurogenic claudication: Secondary | ICD-10-CM

## 2016-08-05 MED ORDER — METHYLPREDNISOLONE ACETATE 40 MG/ML INJ SUSP (RADIOLOG: 120.0000 mg | Freq: Once | INTRAMUSCULAR | Status: DC

## 2016-09-12 NOTE — Addendum Note (Signed)
Addendum  created 09/12/16 1057 by Shana Zavaleta, MD   Sign clinical note    

## 2018-02-25 IMAGING — RF DG C-ARM 61-120 MIN
1 series · 2 of 2 positions shown · non-contrast
Comparison: Thoracic spine radiographs from earlier today.

CLINICAL DATA: T11-12 decompression and fusion.

EXAM:
DG C-ARM 61-120 MIN; THORACOLUMBAR SPINE - 2 VIEW

[Series 1: run · 2 of 2 slices shown]
[im 1/2]
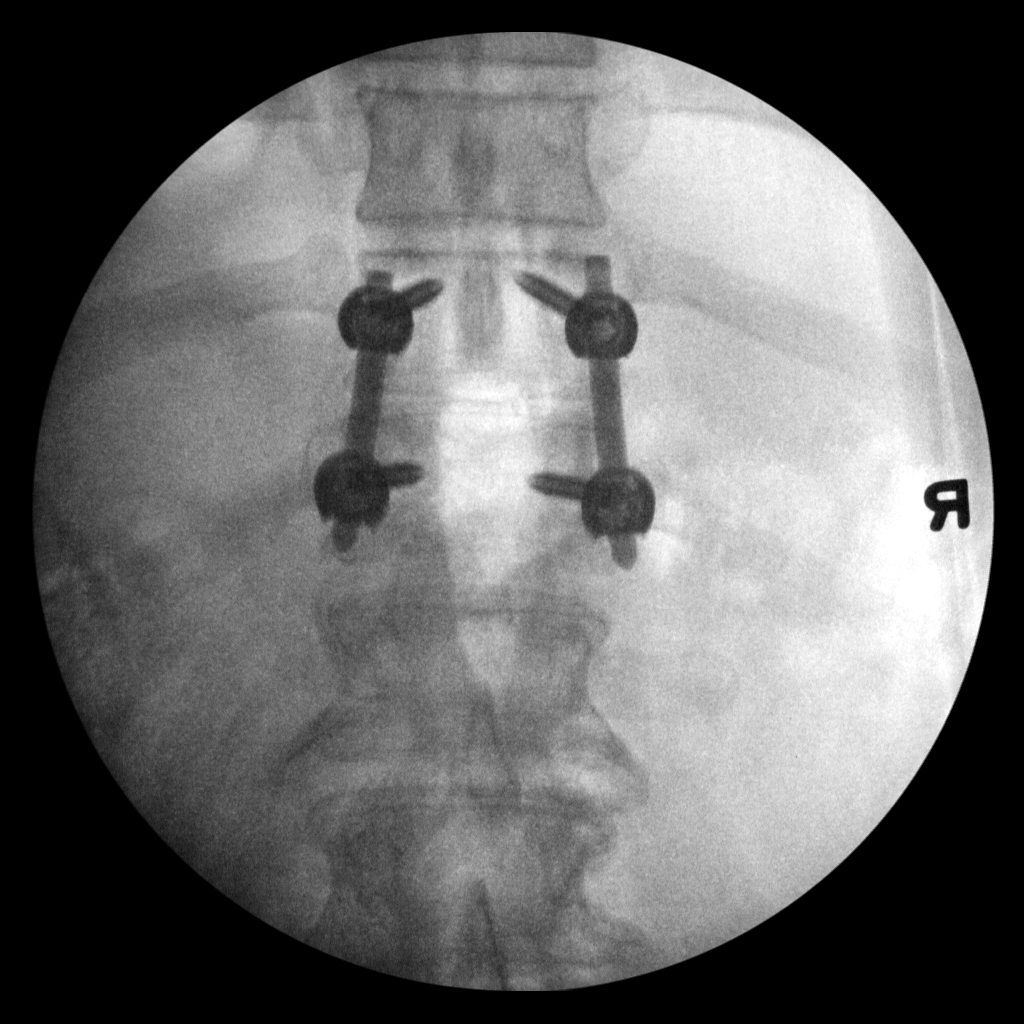
[im 2/2]
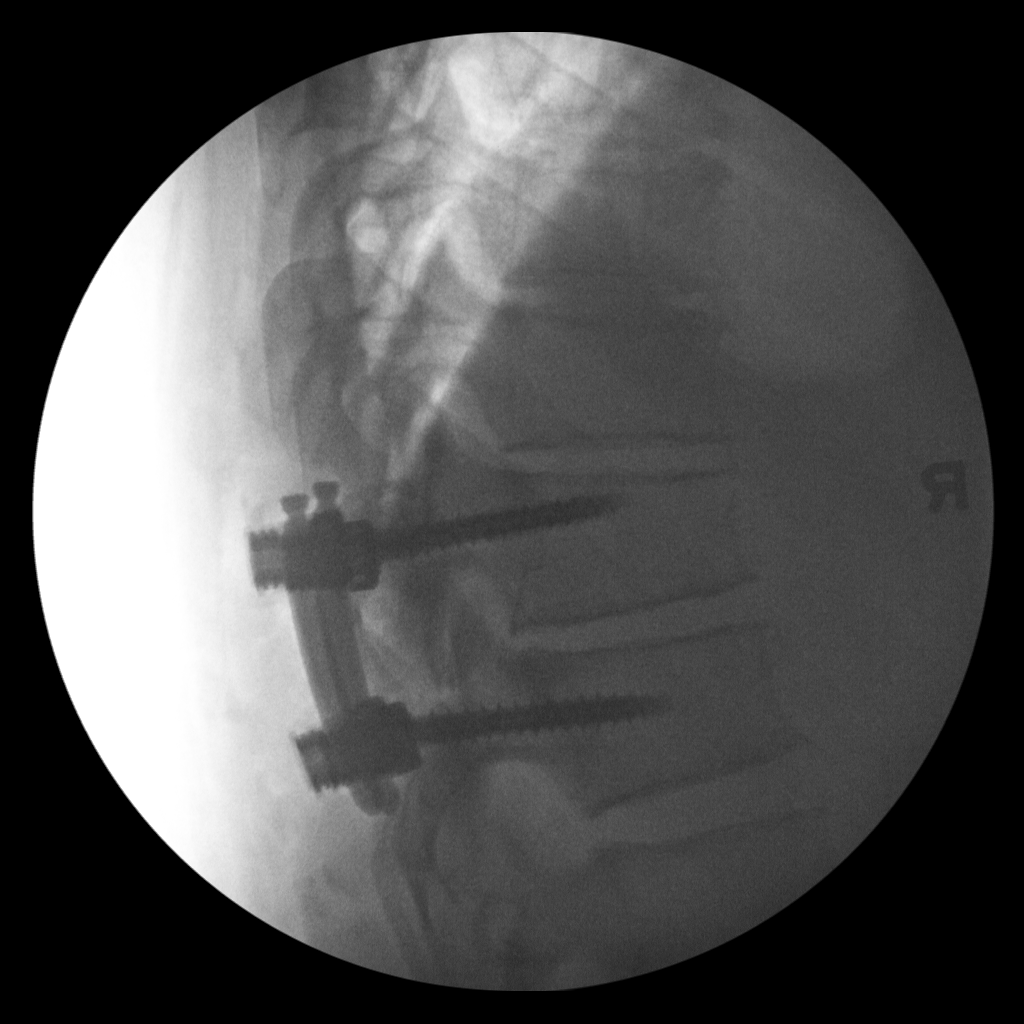

[2 of 2 positions shown; findings below may reference images not displayed]

FINDINGS: Fluoroscopy time 2 minutes 11 seconds. Two nondiagnostic spot
fluoroscopic intraoperative radiographs demonstrate postsurgical
changes from bilateral posterior spinal fusion at T11-12.
IMPRESSION: Intraoperative fluoroscopic guidance for bilateral posterior spinal
fusion at T11-12.

## 2018-07-27 IMAGING — CT CT BIOPSY
1 of 5 series · 10 of 32 positions shown, 16 images · non-contrast
Comparison: none

CLINICAL DATA: Left-sided pelvic and leg pain. Clinical request for
the left sacroiliac injection.

[Series 2: needle -guided injection · axial · 0.82mm/px · z∈[-73,-29]mm · 10 of 28 slices shown, 16 images]
[im 3/28  soft-tissue]
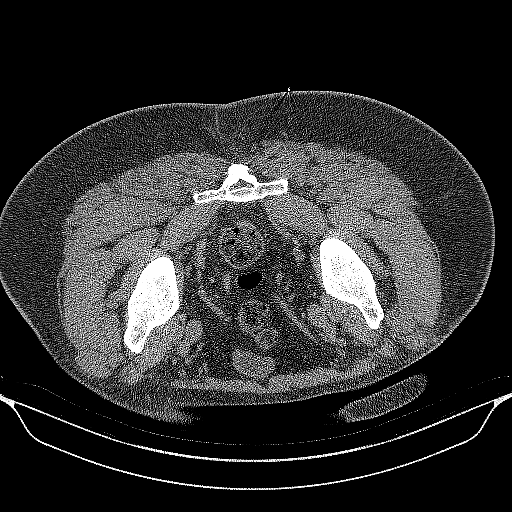
[im 3/28  bone]
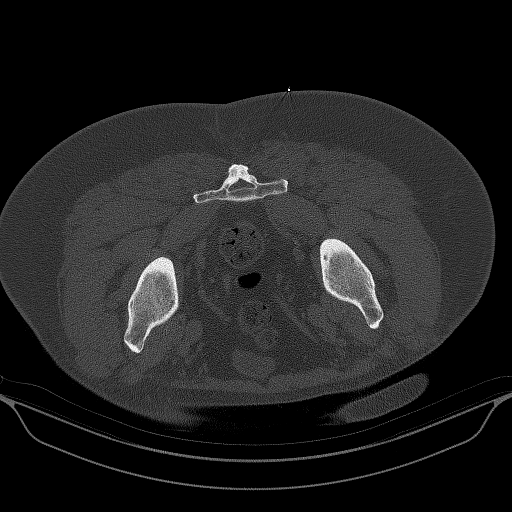
[im 5/28  soft-tissue]
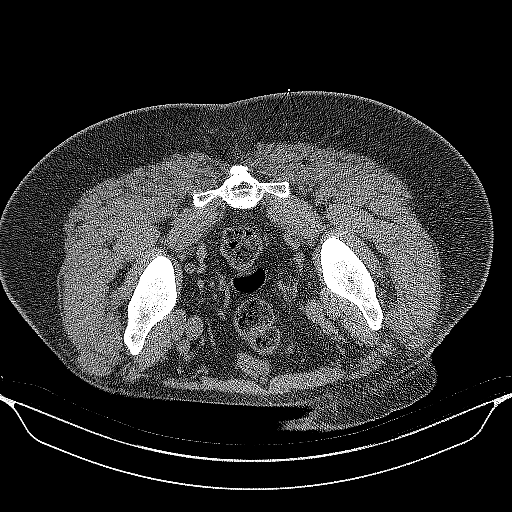
[im 8/28  soft-tissue]
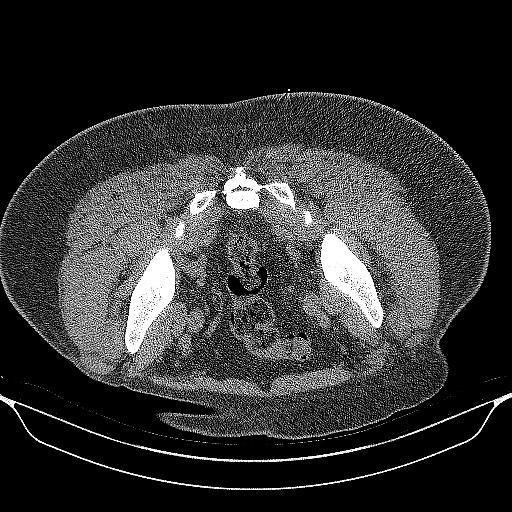
[im 10/28  soft-tissue]
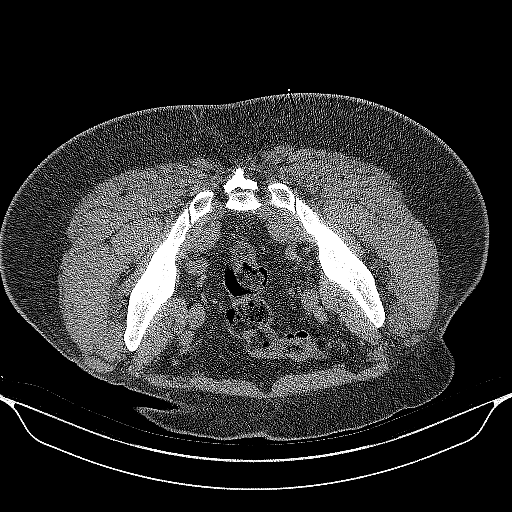
[im 13/28  soft-tissue]
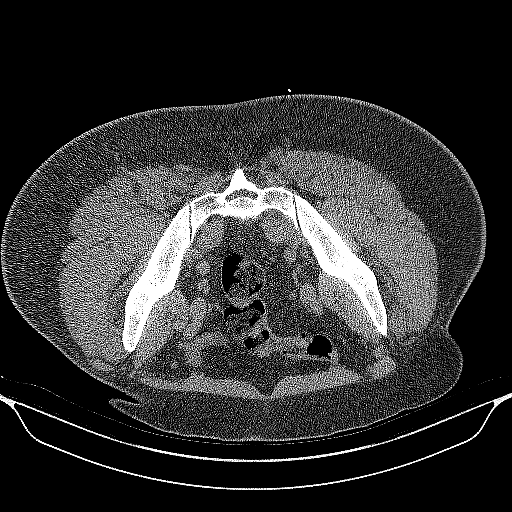
[im 15/28  soft-tissue]
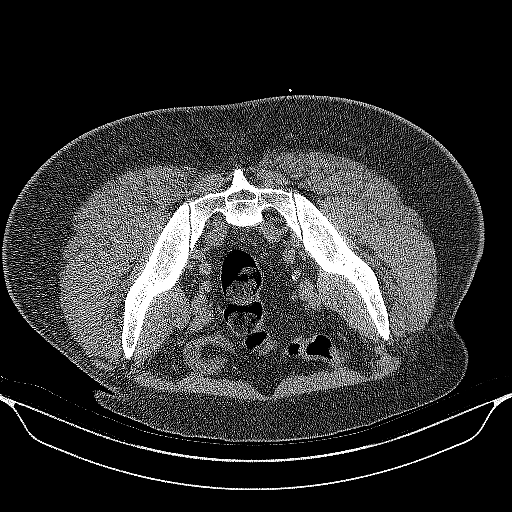
[im 18/28  soft-tissue]
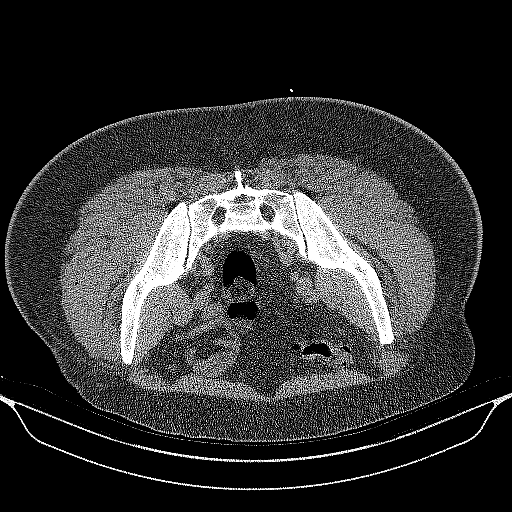
[im 18/28  lung]
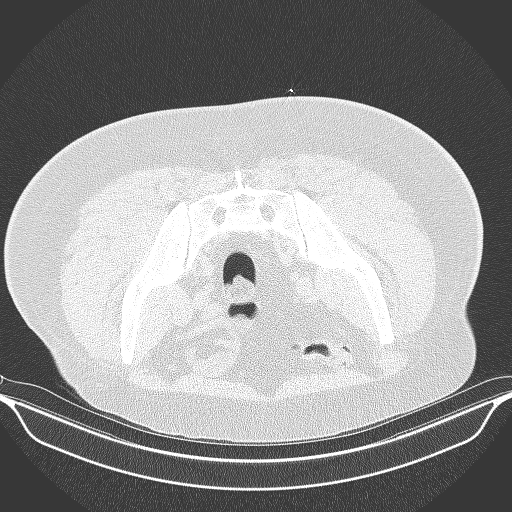
[im 20/28  soft-tissue]
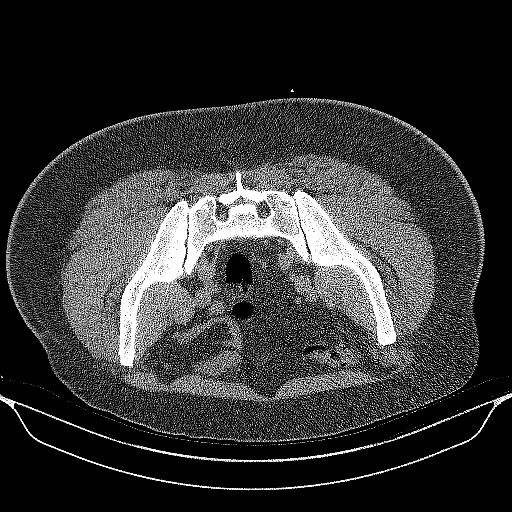
[im 20/28  lung]
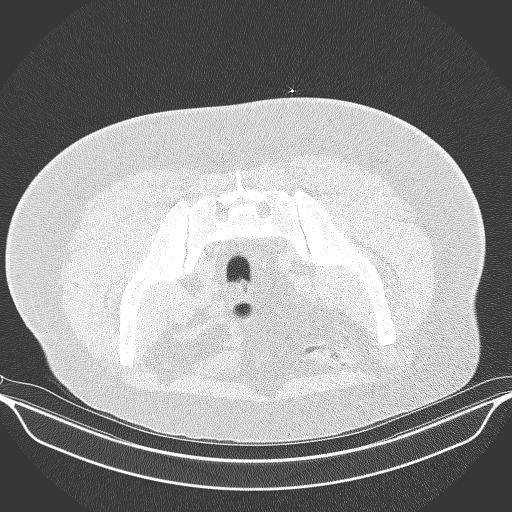
[im 23/28  soft-tissue]
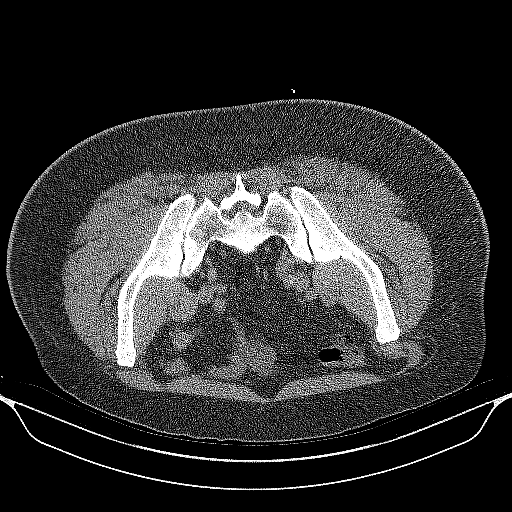
[im 23/28  lung]
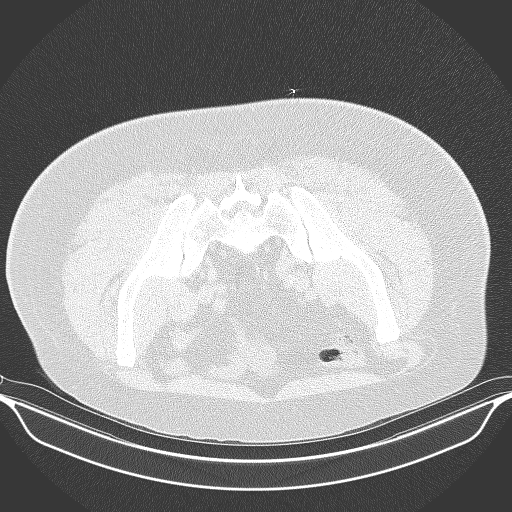
[im 23/28  bone]
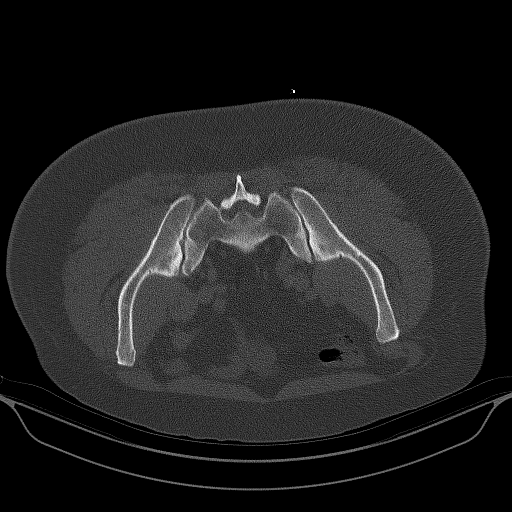
[im 25/28  soft-tissue]
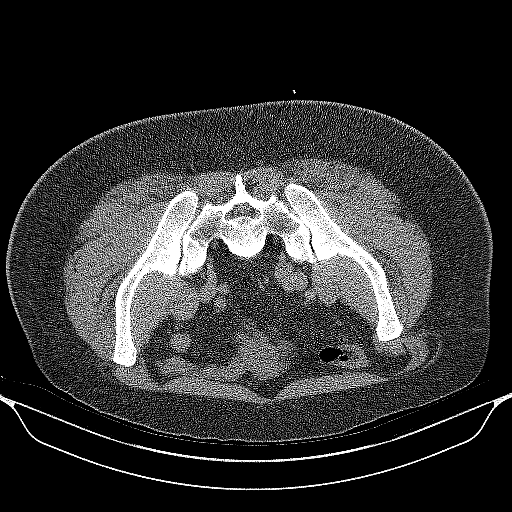
[im 25/28  lung]
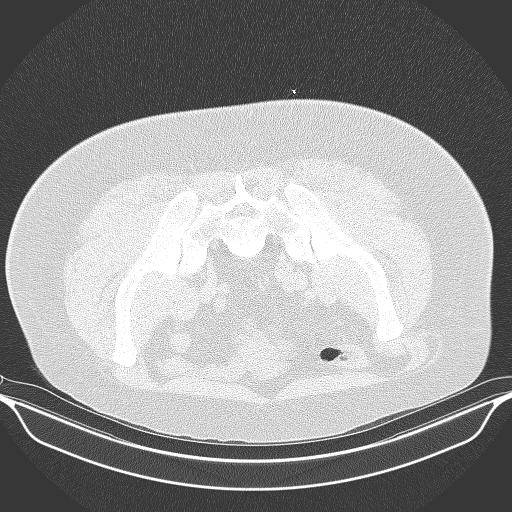

[10 of 32 positions shown; findings below may reference images not displayed]

EXAM:
Left CT GUIDED SI JOINT INJECTION



After local anesthesia with 1% lidocaine without epinephrine and
subsequent deep anesthesia, a 5 inch 22 gauge spinal needle was
advanced into the left SI joint under intermittent CT guidance.

Once the needle was in satisfactory position, representative image
was captured with the needle demonstrated in the sacroiliac joint.
Subsequently, 120 mg Depo-Medrol mixed with 0.25 cc 0.5% bupivacaine
was injected into the left SI joint. Needles removed and a sterile
dressing applied.

No complications were observed.
IMPRESSION: Successful CT-guided left SI joint injection. Concordant symptoms
were noted with the needle placement and steroid administration.

## 2019-09-16 ENCOUNTER — Encounter: Payer: Self-pay | Admitting: *Deleted

## 2019-09-19 NOTE — Progress Notes (Signed)
[facility]    Provider:  Dr Lucia Gaskins Requesting Provider: Venita Lick, MD Primary Care Provider:  Jimmy Footman Sanford,Higgins  CC:  paresthesias  HPI:  Curt Oatis is a 54 y.o. male here as requested by Venita Lick, MD for neuropathy.  Past medical history of thoracolumbar decompression for myelopathy.  I reviewed Dr. Shon Baton notes.  He was seen at [facility] June 25 and reported 2 months of tremors right upper extremity, difficulty eating, dysesthesias into the arms hands and feet bilaterally after having the [employer] vaccination for Covid.  He reported he lost approximately 20 pounds, at that time his symptoms were slowly improving but he continued to have mild to moderate neck pain that radiated into the right upper extremity and numbness and dysesthesias in a stocking glove distribution.  He reported no history of diabetes or peripheral neuropathy.  Imaging studies demonstrated loss of cervical lordosis but no acute fracture or abnormality, his clinical exam demonstrated no signs of symptoms of cervical or thoracic myelopathy, he did have a history of previous lower thoracolumbar decompression for myelopathy which he recovered from.  He had vaccine 3/17 March by that weekend couldn't eat cereal or soup due to severe arm tremors, When he flexes he feels a shock down his spine. SInce the vaccine he has had tremors, paresthesias in the body, what sound Lheurmitte's, also tinglin gin the hands in feet and down the spine with neck flexion. Severe shock, electric, brief, he still has tremors and difficulty with fine motor if fatigued. He lost a lot weight, that is improved, he is feelig better but still symptomatic, no vision changes, no problems with speech, also residual sensory loss in the hands and feet even without head flexion. No Fhx of dementia. No alcohol use. No smoking.   Last 5 years also with "brain fog", memory changes, he denies any sleep apnea, he has a  hx of concussions,  He may watch the same movie over again and forget parts. He feels it is aging. He is on Adderall, he can;t remember people's names, he performs all his ADLs and IADLs. 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 He does not need it every day. But it helps focus.   Reviewed notes, labs and imaging from outside physicians, which showed: see above  Reviewed xr 02/2016 thoracolumbar: agree with findings, reviewed images:  FINDINGS: Fluoroscopy time 2 minutes 11 seconds. Two nondiagnostic spot fluoroscopic intraoperative radiographs demonstrate postsurgical changes from bilateral posterior spinal fusion at T11-12.  IMPRESSION: Intraoperative fluoroscopic guidance for bilateral posterior spinal fusion at T11-12.  Review of Systems: Patient complains of symptoms per HPI as well as the following symptoms: paresthesias . Pertinent negatives and positives per HPI. All others negative.   Social History   Socioeconomic History  . Marital status: Single    Spouse name: Not on file  . Number of children: Not on file  . Years of education: Not on file  . Highest education level: Not on file  Occupational History  . Not on file  Tobacco Use  . Smoking status: Never Smoker  . Smokeless tobacco: Current User    Types: Snuff  Substance and Sexual Activity  . Alcohol use: Not Currently    Alcohol/week: 3.0 - 4.0 standard drinks    Types: 3 - 4 Cans of beer per week  . Drug use: No  . Sexual activity: Not on file  Other Topics Concern  .  Not on file  Social History Narrative   Lives alone    Caffeine: 2 soft drinks/day   Right handed   Social Determinants of Health   Financial Resource Strain:   . Difficulty of Paying Living Expenses: Not on file  Food Insecurity:   . Worried About Programme researcher, broadcasting/film/video in the Last Year: Not on file  . Ran Out of Food in the Last Year: Not on file  Transportation Needs:   . Lack of  Transportation (Medical): Not on file  . Lack of Transportation (Non-Medical): Not on file  Physical Activity:   . Days of Exercise per Week: Not on file  . Minutes of Exercise per Session: Not on file  Stress:   . Feeling of Stress : Not on file  Social Connections:   . Frequency of Communication with Friends and Family: Not on file  . Frequency of Social Gatherings with Friends and Family: Not on file  . Attends Religious Services: Not on file  . Active Member of Clubs or Organizations: Not on file  . Attends Banker Meetings: Not on file  . Marital Status: Not on file  Intimate Partner Violence:   . Fear of Current or Ex-Partner: Not on file  . Emotionally Abused: Not on file  . Physically Abused: Not on file  . Sexually Abused: Not on file    Family History  Problem Relation Age of Onset  . Other Mother        brain stem tumor that affected hearing   . Autoimmune disease Neg Hx     Past Medical History:  Diagnosis Date  . Arthritis   . GERD (gastroesophageal reflux disease)   . History of kidney stones     Patient Active Problem List   Diagnosis Date Noted  . 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 Myelopathy (HCC) 03/06/2016    Past Surgical History:  Procedure Laterality Date  . ACHILLES TENDON SURGERY Right 2004  . CYST EXCISION Right 2011   foot  . LUMBAR LAMINECTOMY/DECOMPRESSION MICRODISCECTOMY N/A 03/06/2016   Procedure: T11-12 Decompression and fusion;  Surgeon: Venita Lick, MD;  Location: [facility] OR;  Service: Orthopedics;  Laterality: N/A;  Requests 4 hours    Current Outpatient Medications  Medication Sig Dispense Refill  . ADDERALL XR 20 MG 24 hr capsule Take 20 mg by mouth daily.  0  . sildenafil (REVATIO) 20 MG tablet Take 20-100 mg by mouth daily as needed (for erectile dysfunction). TAKE ONE TO FIVE TABLETS AS DIRECTED BY PHYSICIAN. DO NOT TAKE MORE THAN 5 TABS IN ANY 24 HOUR PERIOD.    Marland Kitchen meloxicam (MOBIC) 15 MG tablet Take 15 mg by  mouth daily. (Patient not taking: Reported on 09/20/2019)  2   No current facility-administered medications for this visit.    Allergies as of 09/20/2019 - Review Complete 09/20/2019  Allergen Reaction Noted  . No known allergies  03/05/2016    Vitals: BP 109/74 (BP Location: Right Arm, Patient Position: Sitting)   Pulse 76   Ht 5\' 8"  (1.727 m)   Wt 231 lb (104.8 kg)   BMI 35.12 kg/m  Last Weight:  Wt Readings from Last 1 Encounters:  09/20/19 231 lb (104.8 kg)   Last Height:   Ht Readings from Last 1 Encounters:  09/20/19 5\' 8"  (1.727 m)     Physical exam: Exam: Gen: NAD, conversant, well nourised, obese, well groomed  CV: RRR, no MRG. No Carotid Bruits. No peripheral edema, warm, nontender Eyes: Conjunctivae clear without exudates or hemorrhage  Neuro: Detailed Neurologic Exam  Speech:    Speech is normal; fluent and spontaneous with normal comprehension.  Cognition:    The patient is oriented to person, place, and time;     recent and remote memory intact;     language fluent;     normal attention, concentration,     fund of knowledge Cranial Nerves:    The pupils are equal, round, and reactive to light. The fundi are flat. Visual fields are full to finger confrontation. Extraocular movements are intact. Trigeminal sensation is intact and the muscles of mastication are normal. The face is symmetric. The palate elevates in the midline. Hearing intact. Voice is normal. Shoulder shrug is normal. The tongue has normal motion without fasciculations.   Coordination:    No ataxia or dysmetria  Gait:    Heel-toe and tandem gait   Motor Observation:    No asymmetry, no atrophy, and no involuntary movements noted. Tone:    Normal muscle tone.    Posture:    Posture is normal. normal erect    Strength:    Strength is V/V in the upper and lower limbs.      Sensation: intact to LT     Reflex Exam:  DTR's: 1+ lowers, left biceps brisk as  compared to the right      Toes:    The toes are downgoing bilaterally.   Clonus:    Clonus is absent.    Assessment/Plan: This is a very interesting 54 year old gentleman who had the Covid vaccine and then developed a constellation of symptoms below the cervical spine including numbness, tingling, incoordination, ataxia and a Lhermitte sign.  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   the Lhermitte sign suggests a lesion or compression of the upper cervical spinal cord or lower brainstem--usually dorsal columns of the cervical cord or caudal medulla so need MRI of the brain and cervical spine w/wo contrast and bloodwork  Cognitive complaints and h/o concussions, ADHD: MIND DIET - [facility], discussed  Blood work today for reversible causes of memory loss Concusions: Formal memory testing  Orders Placed This Encounter  Procedures  . MR BRAIN W WO CONTRAST  . MR CERVICAL SPINE W WO CONTRAST  . B12 and Folate Panel  . Methylmalonic acid, serum  . TSH  . CBC  . Comprehensive metabolic panel  . Vitamin B1  . Vitamin D, 25-hydroxy  . Hemoglobin A1c   No orders of the defined types were placed in this encounter.   Cc: Venita Lick, MD,  [facility]  Naomie Dean, MD  [facility] [address] [place], [place] [address]  Phone [phone] Fax [phone]

## 2019-09-20 ENCOUNTER — Encounter: Payer: Self-pay | Admitting: Neurology

## 2019-09-20 ENCOUNTER — Ambulatory Visit: Payer: BC Managed Care – PPO | Admitting: Neurology

## 2019-09-20 ENCOUNTER — Other Ambulatory Visit: Payer: Self-pay

## 2019-09-20 VITALS — BP 109/74 | HR 76 | Ht 68.0 in | Wt 231.0 lb

## 2019-09-20 DIAGNOSIS — G373 Acute transverse myelitis in demyelinating disease of central nervous system: Secondary | ICD-10-CM

## 2019-09-20 DIAGNOSIS — G1229 Other motor neuron disease: Secondary | ICD-10-CM

## 2019-09-20 DIAGNOSIS — R270 Ataxia, unspecified: Secondary | ICD-10-CM

## 2019-09-20 DIAGNOSIS — R292 Abnormal reflex: Secondary | ICD-10-CM

## 2019-09-20 DIAGNOSIS — G379 Demyelinating disease of central nervous system, unspecified: Secondary | ICD-10-CM

## 2019-09-20 DIAGNOSIS — T8062XA Other serum reaction due to vaccination, initial encounter: Secondary | ICD-10-CM

## 2019-09-20 DIAGNOSIS — R4189 Other symptoms and signs involving cognitive functions and awareness: Secondary | ICD-10-CM

## 2019-09-20 DIAGNOSIS — R29818 Other symptoms and signs involving the nervous system: Secondary | ICD-10-CM

## 2019-09-20 DIAGNOSIS — R202 Paresthesia of skin: Secondary | ICD-10-CM

## 2019-09-20 NOTE — Patient Instructions (Addendum)
[facility] Blood work MRI of the brain and cervical spine Formal memory testing   Transverse Myelitis?  Transverse myelitis is a condition that causes inflammation of the spinal cord. The inflammation affects the fatty lining that covers spinal cord nerves (myelin). It can cause scarring of nerves, which can interfere with nerve signals passing to and from the spinal cord. Signs and symptoms of this condition happen at the affected level of the spinal cord and below. The condition most often causes weakness of the arms or legs, pain, changes in feeling (sensation) in the arms or legs, and bowel and bladder problems. What are the causes? The exact cause of this condition is not known. It sometimes develops after a viral infection or vaccine, such as herpes, chicken pox, cytomegalovirus, HIV, or Epstein-Barr virus. It can also occur after a bacterial infection or along with diseases that make the body's defense system (immune system) mistakenly attack healthy tissues (autoimmune diseases). What increases the risk? You are more likely to develop this condition if you have an autoimmune disease, such as multiple sclerosis, neuromyelitis optica, or lupus or immune reactions to viruses What are the signs or symptoms? Symptoms of this condition may start suddenly within hours or develop gradually over weeks. Symptoms include:  Pain, especially in the neck, chest, or back, with shooting pains into the legs.  Weakness of the arms or the legs.  Difficulty walking, including foot dragging and stumbling.  Abnormal sensations, such as burning, prickling, numbness, or tingling, in the arms or legs.  Increased sensitivity to touch or changes in temperature.  Bowel and bladder problems, including an increased need to go, loss of control, and difficulty going to the bathroom.  Lhermitte's sign Other symptoms include:  Fatigue.  Fever.  Loss of appetite.  Headache.  Difficulty  breathing.  Paralysis. How is this diagnosed? This condition may be diagnosed with a neurological exam. During this exam, your health care provider will ask about your symptoms and do a complete physical exam to check your spinal cord function. You may need to see a nervous system specialist (neurologist) to have tests. Tests may include:  An MRI to check for inflammation or scarring in the spinal cord.  Blood tests to check for: ? Infections that can trigger this condition. ? Neuromyelitis optica.  A lumbar puncture to check your spinal fluid for signs of infection or inflammation. For this procedure, a small amount of the fluid that surrounds the brain and spinal cord is removed and examined. How is this treated? There is no cure for this condition. You may have treatment to reduce inflammation and manage symptoms. Often, treatment and monitoring are needed in the hospital setting. This condition may be treated with:  Pain medicine.  Corticosteroid medicines to reduce inflammation. These are usually given through an IV at first. Later, they may be taken by mouth.  Breathing support with a device called a respirator.  Physical therapy to: ? Reduce the risk of bedsores. ? Improve muscle strength, flexibility, coordination, and range of motion in affected muscles. ? Reduce muscle spasms and muscle wasting in paralyzed arms or legs. ? Improve control over your bladder and bowel.  Occupational therapy. This therapy helps you learn how to care for yourself and do everyday tasks such as bathing and dressing. It cannot reverse problems caused by this condition, but it can help you become as independent as possible. Follow these instructions at home:  Take over-the-counter and prescription medicines only as  told by your health care provider.  Rest at home as told by your health care provider until you start to recover strength and movement. Ask your health care provider what activities are  safe for you.  Do exercises as told by your health care provider.  Keep all follow-up visits as told by your health care provider. This is important. Contact a health care provider if:  Your symptoms are not improving or are getting worse.  You have symptoms that come back after going away.  You are having a hard time managing at home. Get help right away if you:  Cannot care for yourself at home.  Have trouble breathing. Summary  Transverse myelitis is a condition that causes inflammation of the spinal cord. Signs and symptoms of this condition happen at the affected level of the spinal cord and below.  There is no cure for this condition. You may have treatment in the hospital setting to reduce inflammation and manage symptoms.  Treatment may include pain medicine, corticosteroid medicines, breathing support with a respirator, physical therapy, and occupational therapy.  Rest at home as told by your health care provider until you start to recover strength and movement. Ask your health care provider what activities are safe for you.  Keep all follow-up visits as told by your health care provider. This is important. This information is not intended to replace advice given to you by your health care provider. Make sure you discuss any questions you have with your health care provider. Document Revised: 12/04/2017 Document Reviewed: 12/04/2017 Elsevier Patient Education  2020 [employer].   Memory Compensation Strategies  7. Use "WARM" strategy.  W= write it down  A= associate it  R= repeat it  M= make a mental note  2.   You can keep a Glass blower/designer.  Use a 3-ring notebook with sections for the following: calendar, important names and phone numbers,  medications, doctors' names/phone numbers, lists/reminders, and a section to journal what you did  each day.   3.    Use a calendar to write appointments down.  4.    Write yourself a schedule for the day.  This can be  placed on the calendar or in a separate section of the Memory Notebook.  Keeping a  regular schedule can help memory.  5.    Use medication organizer with sections for each day or morning/evening pills.  You may need help loading it  6.    Keep a basket, or pegboard by the door.  Place items that you need to take out with you in the basket or on the pegboard.  You may also want to  include a message board for reminders.  7.    Use sticky notes.  Place sticky notes with reminders in a place where the task is performed.  For example: " turn off the  stove" placed by the stove, "lock the door" placed on the door at eye level, " take your medications" on  the bathroom mirror or by the place where you normally take your medications.  8.    Use alarms/timers.  Use while cooking to remind yourself to check on food or as a reminder to take your medicine, or as a  reminder to make a call, or as a reminder to perform another task, etc.

## 2019-09-21 ENCOUNTER — Encounter: Payer: Self-pay | Admitting: Neurology

## 2019-09-21 DIAGNOSIS — T8062XA Other serum reaction due to vaccination, initial encounter: Secondary | ICD-10-CM | POA: Insufficient documentation

## 2019-09-22 ENCOUNTER — Telehealth: Payer: Self-pay | Admitting: Neurology

## 2019-09-22 NOTE — Telephone Encounter (Signed)
LVM for pt to call back about scheduling mri  [employer] Auth: [id number] (exp. 09/21/19 to 03/18/20)

## 2019-09-23 ENCOUNTER — Telehealth: Payer: Self-pay | Admitting: *Deleted

## 2019-09-23 LAB — COMPREHENSIVE METABOLIC PANEL
ALT: 33 IU/L (ref 0–44)
AST: 25 IU/L (ref 0–40)
Albumin/Globulin Ratio: 1.7 (ref 1.2–2.2)
Albumin: 4.4 g/dL (ref 3.8–4.9)
Alkaline Phosphatase: 107 IU/L (ref 48–121)
BUN/Creatinine Ratio: 15 (ref 9–20)
BUN: 12 mg/dL (ref 6–24)
Bilirubin Total: 0.5 mg/dL (ref 0.0–1.2)
CO2: 26 mmol/L (ref 20–29)
Calcium: 9.6 mg/dL (ref 8.7–10.2)
Chloride: 101 mmol/L (ref 96–106)
Creatinine, Ser: 0.79 mg/dL (ref 0.76–1.27)
GFR calc Af Amer: 118 mL/min/{1.73_m2} (ref >59)
GFR calc non Af Amer: 102 mL/min/{1.73_m2} (ref >59)
Globulin, Total: 2.6 g/dL (ref 1.5–4.5)
Glucose: 88 mg/dL (ref 65–99)
Potassium: 4.4 mmol/L (ref 3.5–5.2)
Sodium: 140 mmol/L (ref 134–144)
Total Protein: 7 g/dL (ref 6.0–8.5)

## 2019-09-23 LAB — CBC
Hematocrit: 50.5 % (ref 37.5–51.0)
Hemoglobin: 17.6 g/dL (ref 13.0–17.7)
MCH: 32.9 pg (ref 26.6–33.0)
MCHC: 34.9 g/dL (ref 31.5–35.7)
MCV: 94 fL (ref 79–97)
Platelets: 253 10*3/uL (ref 150–450)
RBC: 5.35 x10E6/uL (ref 4.14–5.80)
RDW: 12.1 % (ref 11.6–15.4)
WBC: 8.6 10*3/uL (ref 3.4–10.8)

## 2019-09-23 LAB — VITAMIN D 25 HYDROXY (VIT D DEFICIENCY, FRACTURES): Vit D, 25-Hydroxy: 33.6 ng/mL (ref 30.0–100.0)

## 2019-09-23 LAB — METHYLMALONIC ACID, SERUM: Methylmalonic Acid: 80 nmol/L (ref 0–378)

## 2019-09-23 LAB — HEMOGLOBIN A1C
Est. average glucose Bld gHb Est-mCnc: 111 mg/dL
Hgb A1c MFr Bld: 5.5 % (ref 4.8–5.6)

## 2019-09-23 LAB — TSH: TSH: 1.07 u[IU]/mL (ref 0.450–4.500)

## 2019-09-23 LAB — VITAMIN B1: Thiamine: 155.8 nmol/L (ref 66.5–200.0)

## 2019-09-23 LAB — B12 AND FOLATE PANEL
Folate: 7.7 ng/mL (ref >3.0)
Vitamin B-12: 478 pg/mL (ref 232–1245)

## 2019-09-23 NOTE — Telephone Encounter (Signed)
LVM informing patient his labs arecnormal so far, waiting on one, Vit B1, and we will call if it's abnormal. His Vitamin D is normal but on the low end, recommend increasing foods with vitamin D. Left # for questions.

## 2019-09-28 ENCOUNTER — Telehealth: Payer: Self-pay

## 2019-09-28 DIAGNOSIS — R4189 Other symptoms and signs involving cognitive functions and awareness: Secondary | ICD-10-CM

## 2019-09-28 NOTE — Telephone Encounter (Signed)
Noted, left a voicemail for patient to call back about scheduling his MRI.

## 2019-09-28 NOTE — Telephone Encounter (Signed)
Referral order for formal memory testing ordered per v.o. Dr Frances Furbish.

## 2019-09-28 NOTE — Telephone Encounter (Signed)
I returned the pt's call. He was asking about when he could schedule MRI and formal memory testing. I checked with MRI coordinator who will call him back as soon as possible. Referral has not been placed yet for the formal memory testing but I will clarify with MD and we will order. Pt aware Dr. Marvetta Gibbons office will call him to schedule the testing. He verbalized appreciation for the call.

## 2019-09-28 NOTE — Telephone Encounter (Signed)
Pt left a VM asking for a call back to discuss some questions he has. Please call.

## 2019-09-28 NOTE — Telephone Encounter (Signed)
Okay to refer to neuropsychology for formal cognitive testing.

## 2019-09-28 NOTE — Telephone Encounter (Signed)
Pt left a VM returning the call. Please call [phone].

## 2019-09-28 NOTE — Telephone Encounter (Signed)
x2 left voicemail for patient to call back.  

## 2019-09-28 NOTE — Addendum Note (Signed)
Addended by: Bertram Savin on: 09/28/2019 02:14 PM   Modules accepted: Orders

## 2019-09-29 ENCOUNTER — Encounter: Payer: Self-pay | Admitting: Psychology

## 2019-10-27 NOTE — Telephone Encounter (Signed)
Patient returned my call he is scheduled at [facility] for 12/01/19.

## 2019-12-01 ENCOUNTER — Ambulatory Visit: Payer: BC Managed Care – PPO

## 2019-12-01 DIAGNOSIS — G1229 Other motor neuron disease: Secondary | ICD-10-CM

## 2019-12-01 DIAGNOSIS — R29818 Other symptoms and signs involving the nervous system: Secondary | ICD-10-CM

## 2019-12-01 DIAGNOSIS — G373 Acute transverse myelitis in demyelinating disease of central nervous system: Secondary | ICD-10-CM | POA: Diagnosis not present

## 2019-12-01 DIAGNOSIS — R4189 Other symptoms and signs involving cognitive functions and awareness: Secondary | ICD-10-CM

## 2019-12-01 DIAGNOSIS — R202 Paresthesia of skin: Secondary | ICD-10-CM

## 2019-12-01 DIAGNOSIS — R292 Abnormal reflex: Secondary | ICD-10-CM

## 2019-12-01 DIAGNOSIS — G379 Demyelinating disease of central nervous system, unspecified: Secondary | ICD-10-CM

## 2019-12-01 DIAGNOSIS — R270 Ataxia, unspecified: Secondary | ICD-10-CM | POA: Diagnosis not present

## 2019-12-01 MED ORDER — GADOBENATE DIMEGLUMINE 529 MG/ML IV SOLN
20.0000 mL | Freq: Once | INTRAVENOUS | Status: AC | PRN
  Administered 2019-12-01: 20 mL via INTRAVENOUS

## 2019-12-20 ENCOUNTER — Telehealth: Payer: Self-pay | Admitting: Neurology

## 2019-12-20 NOTE — Telephone Encounter (Signed)
Phone rep worked Risk manager, pt asked if the results to his MRI are available.  Also pt wanted to know if he could be seen the week of the 20th in Dec.  Pt was told a message would be sent to RN re: the request for MRI results and that he would be placed on wait list in hopes of being seen sometime in the week of 12-20.

## 2019-12-21 NOTE — Telephone Encounter (Signed)
Spoke to pt went over message from MD for mri results, pt voiced understanding and all questions answered.

## 2020-01-12 ENCOUNTER — Encounter: Payer: Self-pay | Admitting: Psychology

## 2020-01-12 ENCOUNTER — Other Ambulatory Visit: Payer: Self-pay

## 2020-01-12 ENCOUNTER — Encounter: Payer: BC Managed Care – PPO | Attending: Psychology | Admitting: Psychology

## 2020-01-12 DIAGNOSIS — G959 Disease of spinal cord, unspecified: Secondary | ICD-10-CM | POA: Insufficient documentation

## 2020-01-12 DIAGNOSIS — F908 Attention-deficit hyperactivity disorder, other type: Secondary | ICD-10-CM | POA: Diagnosis not present

## 2020-01-12 DIAGNOSIS — R413 Other amnesia: Secondary | ICD-10-CM | POA: Diagnosis not present

## 2020-01-12 DIAGNOSIS — T8062XA Other serum reaction due to vaccination, initial encounter: Secondary | ICD-10-CM | POA: Insufficient documentation

## 2020-01-12 NOTE — Progress Notes (Signed)
Neuropsychological Consultation   Patient:   Rickey Stafford   DOB:   [birth year]  MR Number:  [id number]  Location:  [facility] MEDICINE [facility] PHYSICAL MEDICINE AND REHABILITATION [address] [place], [address] [id number] [facility] [place] [address] Dept: [phone]           Date of Service:   01/12/2020  Start Time:   9 AM End Time:   11 AM  Today's visit was an in person visit that was conducted in my outpatient clinic office.  The patient myself were present for this clinical interview with no other family members present.  The first hour and 20 minutes were spent in a face-to-face clinical interview going over history and current symptomatology.  The next 40 minutes were spent in records review establishing testing parameters etc.  Provider/Observer:  Arley Phenix, Psy.D.       Clinical Neuropsychologist       Billing Code/Service: 96116/96121  Chief Complaint:    Rickey Stafford is a 54 year old male who was referred for neuropsychological evaluation by Naomie Dean, MD with [facility] due to reports of memory issues and attentional deficits addressed during the neurological work-up for acute transverse myelitis potentially developing post COVID-19 (J&J vaccine) vaccination.  The patient has a history of previous diagnosis of adult residual attention deficit disorder and reports successful treatment of his attentional issues with psychostimulant medications (Adderall).  The patient also has a history of multiple concussive events with altered consciousness mostly while playing sports including football.  The patient describes memory and attention/concentration issues that have been persistent for some time and preexisted the acute symptoms he developed consistent with transverse myelitis.  Reason for Service:  Rickey Stafford is a 54 year old male who was referred for neuropsychological evaluation by Naomie Dean, MD with  [facility] due to reports of memory issues and attentional deficits addressed during the neurological work-up for acute transverse myelitis potentially developing post COVID-19 (J&J vaccine) vaccination.  The patient has a history of previous diagnosis of adult residual attention deficit disorder and reports successful treatment of his attentional issues with psychostimulant medications (Adderall).  The patient also has a history of multiple concussive events with altered consciousness mostly while playing sports including football.  The patient describes memory and attention/concentration issues that have been persistent for some time and preexisted the acute symptoms he developed consistent with transverse myelitis.  The patient was initially referred to Dr. Lucia Gaskins by his orthopedic surgeon Dr. Shon Baton with [name] on June 25.  The patient described 2 months of significant tremor in right upper extremity as well as to some degree his left upper extremity, difficulty eating due to motor deficits, dysesthesias into his arms and hands as well as feet bilaterally.  He reports that his "Parkinson's type" symptoms included shaking and motor deficits for 2-1/2 months.  These happened in his arms and hands, fingers and toes and experienced tingling in his fingers and toes.  The patient reports that he lost approximately 20 pounds over the first month of the symptoms.  He reports that he continues to get "chills on a daily basis."  The patient reports that most of these motor deficits have decreased significantly although he does end up having some acute symptoms when leaning his head very far forward.  The patient reports that he had the J & J vaccine on March 17 and by that weekend he could not eat food due to severe arm tremors.  He described feeling  a shock/electrical sensation running down his spine when he flexes or moves his head in particular ways.  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  The patient reports that he still has these types of symptoms.  The patient has denied any vision changes, problems with his speech.  Also, during the neurological evaluation as well as today the patient describes at least a 5-year history of ongoing cognitive difficulties particular around attention and concentration, memory difficulties including difficulty with new information as well as recalling past information.  The patient reports that he saw physician several years ago when his stepson was being evaluated for ADHD and he completed the questionnaire as well and was diagnosed with adult residual attention deficit disorder.  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  The patient does not use his medicine every day but uses it when he needs particularly to maintain focus and attention.  The patient had taken Adderall 15 mg but has not taken this medicine over the past 30 days.  Later, the patient saw a second doctor who evaluated him and felt that his symptoms were more related to depression and prescribed an SSRI medication.  However, the patient reports that he did very poorly on the SSRI medication and it was discontinued rather quickly.  The patient also acknowledges a past history of significant concussive events.  The patient describes at least 2 significant events with alterations while playing football and suffering head trauma.  The patient played football for 9 years starting in grade school through high school.  However, he reports multiple times of other concussive events almost all happening while playing football.  The patient was not able to specifically identify attentional deficits going back  into elementary school and reports that he did not always put full effort in his at academic work.  The patient reports that in elementary school he did not get into a great deal of trouble particularly and always follow rules so he would not get punished by his father for misbehaving.  It is unclear whether the patient's attentional deficits existed by 8 7 or 8 and therefore it is unclear whether his attentional deficits could be due to postconcussional/mild head trauma versus attention deficit disorder.  In any respect, the patient reports significant improvement in his attention and concentration with a long-acting amphetamine.  The patient had a previous back surgery due to nerve impingement and apparent stenosis by Dr. Shon Baton with [facility] in 2019.  Only other past history includes history of kidney stones and GERD.  Had cervical MRI conducted on 12/02/2019 with clinical impressions provided by Dr. 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  He also identified C6-7 disc bulging and facet hypertrophy with mild left foraminal stenosis.  The patient had a brain MRI conducted on 12/02/2019 that was interpreted as unremarkable MRI brain with mild chronic sinusitis changes with no indications of acute abnormalities or other lesions.  The patient describes his sleep patterns as sleeping between 6 and 8 hours most nights.  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  The patient describes his appetite is good and has a good appetite.  He describes memory difficulties both short and long-term memory and describes them as "bad."  Behavioral Observation: Rickey Stafford  presents as a 54 y.o.-year-old Right handed Caucasian Male who appeared his stated age. his dress was Appropriate and he was Well Groomed and his manners  were Appropriate to the situation.  his participation was indicative of Appropriate, Inattentive and Redirectable behaviors.  There were not physical disabilities noted.  he displayed an appropriate level of cooperation and motivation.     Interactions:    Active Appropriate and Inattentive  Attention:   abnormal and attention span appeared shorter than expected for age  Memory:   abnormal; remote memory intact, recent memory impaired  Visuo-spatial:  not examined  Speech (Volume):  normal  Speech:   normal; normal  Thought Process:  Coherent and Tangential  Though Content:  WNL; not suicidal and not homicidal  Orientation:   person, place, time/date and situation  Judgment:   Good  Planning:   Good  Affect:    Appropriate  Mood:    Euthymic  Insight:   Good  Intelligence:   high  Marital Status/Living: The patient was born and raised in [place] along with 3 siblings.  The patient reports that currently he spends 10 months or so year in his truck with his job otherwise he lives with his sister or parents.  He has been doing this for the past 3 years.  The patient is single/divorced and was married for 17 years.  He has no children.  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  Hobbies and interests include fishing and woodworking.  Substance Use:  No concerns of substance abuse are reported.  The patient does use tobacco products (dip) and uses approximately 1 can of smokeless tobacco per week.  Education:   The patient graduated from college with a 3.2 GPA graduating from [employer].  Medical History:   Past Medical History:  Diagnosis Date  . Arthritis   . GERD (gastroesophageal reflux disease)   . History of kidney stones          Patient Active Problem List   Diagnosis Date  Noted  . 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 Myelopathy (HCC) 03/06/2016              Psychiatric History:  No prior psychiatric history other than previous history of diagnoses of adult residual attention deficit disorder.  However, it is not absolutely clear whether his attentional deficits and difficulties are due to adult residual attention deficit versus mild head injury/head trauma/postconcussion syndrome.  Family Med/Psych History:  Family History  Problem Relation Age of Onset  . Other Mother        brain stem tumor that affected hearing   . Autoimmune disease Neg Hx     Impression/DX:  Jahmeer Porche is a 54 year old male who was referred for neuropsychological evaluation by Naomie Dean, MD with [facility] due to reports of memory issues and attentional deficits addressed during the neurological work-up for acute transverse myelitis potentially developing post COVID-19 (J&J vaccine) vaccination.  The patient has a history of previous diagnosis of adult residual attention deficit disorder and reports successful treatment of his attentional issues with  psychostimulant medications (Adderall).  The patient also has a history of multiple concussive events with altered consciousness mostly while playing sports including football.  The patient describes memory and attention/concentration issues that have been persistent for some time and preexisted the acute symptoms he developed consistent with transverse myelitis.  The primary issue for his referral here has to do with the patient's descriptions of a 5-year history of more of worsening memory and cognitive difficulties to facilitate diagnostic considerations and ongoing care around attentional and memory issues.  The patient appears to have improved significantly from his transverse myelitis symptoms which is what has been typically seen in the literature regarding individual cases with acute onset of transverse myelitis after  COVID-19 vaccination or even more frequently COVID-19 infection.  The patient reports that he is having some residual symptoms with mechanical head turns particularly turning his head downward and there have been some structural abnormalities noted on his cervical CT scan which may need to be addressed that may be incidental findings and symptoms that were exacerbated by his transverse myelitis but may be separate to some degree from the symptoms.  Disposition/Plan:  We have set the patient up for formal neuropsychological testing.  Initially we will look at broad range of cognitive functioning and specific issues with memory and learning.  Depending on how he does on various aspects of memory and learning and particular attention and concentration issues the patient may also be administered the comprehensive attention battery additionally to ferret out some issues concerning the differential of ADHD versus mild head injury pertaining to his attentional and concentration of deficits.  We will administer the Wechsler Adult Intelligence Scale-IV as well as the Wechsler Memory Scale-IV.  If needed and determined we will also administer the comprehensive attention battery and the CAB CPT measures.  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 Clinical Neuropsychologist

## 2020-01-25 ENCOUNTER — Telehealth: Payer: Self-pay | Admitting: Neurology

## 2020-01-25 NOTE — Telephone Encounter (Signed)
Pt called to report that by accident he cancelled his appointment for 01-06.  Pt was told of next avail and the option of the wait list.  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  Pt asking to be called if anything should open up, the way the wait list works was explained.  Pt accepted the March appointment date.  Please call if an available slot becomes available within the next few days.  It was explained multiple times there would be no guarantee of an available slot opening for him.

## 2020-01-25 NOTE — Telephone Encounter (Signed)
Spoke with patient. This follow up was to address his paresthesias and memory concerns. He has formal memory testing coming up with Dr Kieth Brightly on 1/11 and follow up on 3/10. I discussed with the patient that ideally Dr Lucia Gaskins would like the results of the memory testing first before she sees him. The patient said his shocking in the fingers and toes is stable at about 20%. He only reported one recent "parkinsons" type episode and that was last week. Other than that it happened months ago and it's associated with fatigue. His fine motor skills are affected during the episode. The patient is ok with waiting to do one follow up at once in March as long as Dr Lucia Gaskins is ok with this.

## 2020-01-25 NOTE — Telephone Encounter (Signed)
I would like to wait until after he has met with the neuropsychologist to review the findings. thanks

## 2020-01-25 NOTE — Telephone Encounter (Signed)
Noted  

## 2020-01-27 ENCOUNTER — Ambulatory Visit: Payer: BC Managed Care – PPO | Admitting: Neurology

## 2020-02-01 ENCOUNTER — Encounter: Payer: Self-pay | Admitting: Psychology

## 2020-02-01 ENCOUNTER — Other Ambulatory Visit: Payer: Self-pay

## 2020-02-01 ENCOUNTER — Encounter: Payer: BC Managed Care – PPO | Attending: Psychology | Admitting: Psychology

## 2020-02-01 DIAGNOSIS — R413 Other amnesia: Secondary | ICD-10-CM | POA: Diagnosis present

## 2020-02-01 DIAGNOSIS — F908 Attention-deficit hyperactivity disorder, other type: Secondary | ICD-10-CM | POA: Insufficient documentation

## 2020-02-01 NOTE — Progress Notes (Addendum)
Neuropsychology Note  Rickey Stafford completed 240 minutes of neuropsychological testing with this provider. The patient did not appear overtly distressed by the testing session, per behavioral observation or via self-report. Rest breaks were offered. He wore corrective lenses and did not have trouble seeing, hearing, or understanding tests instructions. Effort and motivation were excellent. Lost instructional set on [employer] Addition subtest.    Clinical Decision Making: In considering the patient's current level of functioning, level of presumed impairment, nature of symptoms, emotional and behavioral responses during the interview, level of literacy, and observed level of motivation/effort, a battery of tests was selected and completed by patient.  Changes were made as deemed necessary based on patient performance on testing, my observations, and additional pertinent factors such as those listed above.  Tests Administered:  Wechsler Adult Intelligence Scale, 4th Edition (WAIS-IV)  Wechsler Memory Scale, 4th Edition (WMS-IV); Adult Battery   Results:  Composite Score Summary  Scale Sum of Scaled Scores Composite Score Percentile Rank 95% Conf. Interval Qualitative Description  Verbal Comprehension 33 VCI 105 63 99-110 Average  Perceptual Reasoning 33 PRI 105 63 99-111 Average  Working Memory 23 WMI 108 70 101-114 Average  Processing Speed 20 PSI 100 50 92-108 Average  Full Scale 109 FSIQ 106 66 102-110 Average  General Ability 66 GAI 105 63 100-110 Average    Verbal Comprehension Subtests Summary  Subtest Raw Score Scaled Score Percentile Rank Reference Group Scaled Score SEM  Similarities 32 14 91 14 1.04  Vocabulary 44 11 63 13 0.73  Information 11 8 25 9  0.73    Perceptual Reasoning Subtests Summary  Subtest Raw Score Scaled Score Percentile Rank Reference Group Scaled Score SEM  Block Design 49 12 75 11 0.95  Matrix Reasoning 17 10 50 9 0.95  Visual Puzzles 15  11 63 10 0.85    Working Raw Score Scaled Score Percentile Rank Reference Group Scaled Score SEM  Digit Span 32 12 75 12 0.73  Arithmetic 16 11 63 11 0.90    Working Memory Process Score Summary  Process Score Raw Score Scaled Score Percentile Rank Base Rate SEM  Digit Span Forward 12 12 75 -- 1.24  Digit Span Backward 10 11 63 -- 1.12  Digit Span Sequencing 10 12 75 -- 1.27  Longest Digit Span Forward 7 -- -- 61.5 --  Longest Digit Span Backward 5 -- -- 55.5 --  Longest Digit Span Sequence 7 -- -- 23.0 --    Processing Speed Subtests Summary  Subtest Raw Score Scaled Score Percentile Rank Reference Group Scaled Score SEM  Symbol Search 33 11 63 10 1.56  Coding 60 9 37 8 1.20    Brief Cognitive Status Exam Classification  Age Years of Education Raw Score Classification Level Base Rate  54 years 4 months 16 52 Low Average 22.1    Index Score Summary  Index Sum of Scaled Scores Index Score Percentile Rank 95% Confidence Interval Qualitative Descriptor  Auditory Memory (AMI) 44 105 63 99-111 Average  Visual Memory (VMI) 48 112 79 106-117 High Average  Visual Working Memory (VWMI) 17 91 27 84-99 Average  Immediate Memory (IMI) 44 107 68 100-113 Average  Delayed Memory (DMI) 48 114 82 106-120 High Average     Primary Subtest Scaled Score Summary  Subtest Domain Raw Score Scaled Score Percentile Rank  Logical Memory I AM 30 12 75  Logical Memory II AM 25 12 75  Verbal Paired Associates I  AM 28 9 37  Verbal [employer] II AM 11 11 63  Designs I VM 69 10 50  Designs II VM 53 10 50  Visual Reproduction I VM 41 13 84  Visual Reproduction II VM 38 15 95  Spatial Addition VWM 8 7 16   Symbol Span VWM 24 10 50    Auditory Memory Process Score Summary  Process Score Raw Score Scaled Score Percentile Rank Cumulative Percentage (Base Rate)  LM II Recognition 25 - - 51-75%  VPA II Recognition 39 - - 51-75%    Visual Memory Process  Score Summary  Process Score Raw Score Scaled Score Percentile Rank Cumulative Percentage (Base Rate)  DE I Content 35 10 50 -  DE I Spatial 16 9 37 -  DE II Content 35 10 50 -  DE II Spatial 10 8 25  -  DE II Recognition 15 - - 51-75%  VR II Recognition 7 - - >75%    ABILITY-MEMORY ANALYSIS  Ability Score:  GAI: 105 Date of Testing:  WAIS-IV; WMS-IV 2020/02/01  Predicted Difference Method   Index Predicted WMS-IV Index Score Actual WMS-IV Index Score Difference Critical Value  Significant Difference Y/N Base Rate  Auditory Memory 103 105 -2 9.38 N   Visual Memory 103 112 -9 9.36 N   Visual Working Memory 103 91 12 11.05 Y 15%  Immediate Memory 103 107 -4 10.29 N   Delayed Memory 103 114 -11 10.13 Y 20%  Statistical significance (critical value) at the .01 level.   Contrast Scaled Scores  Score Score 1 Score 2 Contrast Scaled Score  General Ability Index vs. Auditory Memory Index 105 105 11  General Ability Index vs. Visual Memory Index 105 112 12  General Ability Index vs. Visual Working Memory Index 105 91 7  General Ability Index vs. Immediate Memory Index 105 107 11  General Ability Index vs. Delayed Memory Index 105 114 13  Verbal Comprehension Index vs. Auditory Memory Index 105 105 11  Perceptual Reasoning Index vs. Visual Memory Index 105 112 12  Perceptual Reasoning Index vs. Visual Working Memory Index 105 91 6  Working Memory Index vs. Auditory Memory Index 108 105 11  Working Memory Index vs. Visual Working Memory Index 108 91 7     Feedback with Patient:  Patient will return for an interactive feedback session with Dr. on 03/30/20 at which time his test performances, clinical impressions and treatment recommendations will be reviewed in detail. The patient understands he can contact our office should he require our assistance before this time.   Full report to follow.

## 2020-02-02 NOTE — Telephone Encounter (Signed)
Pt had asked for MRI results to be sent to Dr Shon Baton and I faxed them to Dr Shon Baton via routing option in [place].

## 2020-03-02 ENCOUNTER — Other Ambulatory Visit: Payer: Self-pay

## 2020-03-02 ENCOUNTER — Encounter: Payer: BC Managed Care – PPO | Attending: Psychology | Admitting: Psychology

## 2020-03-02 DIAGNOSIS — G959 Disease of spinal cord, unspecified: Secondary | ICD-10-CM | POA: Diagnosis present

## 2020-03-02 DIAGNOSIS — F908 Attention-deficit hyperactivity disorder, other type: Secondary | ICD-10-CM | POA: Diagnosis present

## 2020-03-02 DIAGNOSIS — T8062XA Other serum reaction due to vaccination, initial encounter: Secondary | ICD-10-CM | POA: Insufficient documentation

## 2020-03-02 NOTE — Progress Notes (Addendum)
Neuropsychological Evaluation   Patient:  Rickey Stafford   DOB: June 30, 1965  MR Number: [id number]  Location: [facility] REH[facility]ILITATION [address] [place], [address] [id number] [facility] [place] [address] Dept: [phone]  Start: 8 AM End: 9 AM  Provider/Observer:     Hershal Coria PsyD  Chief Complaint:      Chief Complaint  Patient presents with  . Memory Loss  . ADHD    Patient has a history of ADHD but also has a history of previous postconcussive events in childhood  . Other    Transverse myelitis post Covid    Reason For Service:     Rickey Stafford is a 55 year old male who was referred for neuropsychological evaluation by Naomie Dean, MD with [facility] due to reports of memory issues and attentional deficits addressed during the neurological work-up for acute transverse myelitis potentially developing post COVID-19 (J&J vaccine) vaccination.  The patient has a history of previous diagnosis of adult residual attention deficit disorder and reports successful treatment of his attentional issues with psychostimulant medications (Adderall).  The patient also has a history of multiple concussive events with altered consciousness mostly while playing sports including football.  The patient describes memory and attention/concentration issues that have been persistent for some time and preexisted the acute symptoms he developed consistent with transverse myelitis.  The patient was initially referred to Dr. Lucia Gaskins by his orthopedic surgeon Dr. Shon Baton with [name] on June 25.  The patient described 2 months of significant tremor in right upper extremity as well as to some degree his left upper extremity, difficulty eating due to motor deficits, dysesthesias into his arms and hands as well as feet bilaterally.  He reports that his "Parkinson's type" symptoms included shaking and motor deficits  for 2-1/2 months.  These happened in his arms and hands, fingers and toes and experienced tingling in his fingers and toes.  The patient reports that he lost approximately 20 pounds over the first month of the symptoms.  He reports that he continues to get "chills on a daily basis."  The patient reports that most of these motor deficits have decreased significantly although he does end up having some acute symptoms when leaning his head very far forward.  The patient reports that he had the J & J vaccine on March 17 and by that weekend he could not eat food due to severe arm tremors.  He described feeling a shock/electrical sensation running down his spine when he flexes or moves his head in particular ways.  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  The patient reports that he still has these types of symptoms.  The patient has denied any vision changes, problems with his speech.  Also, during the neurological evaluation as well as today the patient describes at least a 5-year history of ongoing cognitive difficulties particular around attention and concentration, memory difficulties including difficulty with new information as well as recalling past information.  The patient reports that he saw physician several years ago when his stepson was being evaluated for ADHD and he completed the questionnaire as well and was diagnosed with adult residual attention deficit disorder.  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  The patient does not use his medicine every day but uses it when he needs particularly to maintain focus and attention.  The patient had taken Adderall 15 mg but has not taken this medicine over the past 30  days.  Later, the patient saw a second doctor who evaluated him and felt that his symptoms were more related to depression and prescribed an SSRI medication.  However, the patient reports that he did very poorly on the SSRI medication and it was discontinued rather quickly.  The patient also acknowledges a past history of significant concussive events.  The patient describes at least 2 significant events with alterations while playing football and suffering head trauma.  The patient played football for 9 years starting in grade school through high school.  However, he reports multiple times of other concussive events almost all happening while playing football.  The patient was not able to specifically identify attentional deficits going back into elementary school and reports that he did not always put full effort in his at academic work.  The patient reports that in elementary school he did not get into a great deal of trouble particularly and always follow rules so he would not get punished by his father for misbehaving.  It is unclear whether the patient's attentional deficits existed by 7 or 8 and therefore it is unclear whether his attentional deficits could be due to postconcussional/mild head trauma versus attention deficit disorder.  In any respect, the patient reports significant improvement in his attention and concentration with a long-acting amphetamine.  The patient had a previous back surgery due to nerve impingement and apparent stenosis by Dr. Shon Baton with [facility] in 2019.  Only other past history includes history of kidney stones and GERD.  Had cervical MRI conducted on 12/02/2019 with clinical impressions provided by Dr. 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  He also identified C6-7 disc bulging and facet hypertrophy with mild left foraminal stenosis.  The patient had  a brain MRI conducted on 12/02/2019 that was interpreted as unremarkable MRI brain with mild chronic sinusitis changes with no indications of acute abnormalities or other lesions.  The patient describes his sleep patterns as sleeping between 6 and 8 hours most nights.  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  The patient describes his appetite as good and has a good appetite.  He describes memory difficulties both short and long-term memory and describes them as "bad."  Jethro Bolus completed 240 minutes of neuropsychological testing with this provider. The patient did not appear overtly distressed by the testing session, per behavioral observation or via self-report. Rest breaks were offered. He wore corrective lenses and did not have trouble seeing, hearing, or understanding tests instructions. Effort and motivation were excellent. Lost instructional set on [employer] Addition subtest.    Clinical Decision Making: In considering the patient's current level of functioning, level of presumed impairment, nature of symptoms, emotional and behavioral responses during the interview, level of literacy, and observed level of motivation/effort, a battery of tests was selected and completed by patient.  Changes were made as deemed necessary based on patient performance on testing, direct observations, and additional pertinent factors such as those listed above.  Standardized test procedures were administered by Dr. Vella Kohler.  Tests Administered:  Wechsler Adult Intelligence Scale,  4th Edition (WAIS-IV)  Wechsler Memory Scale, 4th Edition (WMS-IV); Adult Battery   Test Results:   Initially, an estimation was made as to historical/premorbid intellectual and cognitive functioning based on various life factors including education, occupational history and other psychosocial variables.  It is estimated that the patient likely  performed somewhere between 110 and 120 on standardized composite score variables and likely performing somewhere between 10 and 11 on various scaled score of measures.  There also is very likely to be some degree of variability in this range with some measures historically performing higher than others.            Composite Score Summary   Scale Sum of Scaled Scores Composite Score Percentile Rank 95% Conf. Interval Qualitative Description  Verbal Comprehension 33 VCI 105 63 99-110 Average  Perceptual Reasoning 33 PRI 105 63 99-111 Average  Working Memory 23 WMI 108 70 101-114 Average  Processing Speed 20 PSI 100 50 92-108 Average  Full Scale 109 FSIQ 106 66 102-110 Average  General Ability 66 GAI 105 63 100-110 Average    The patient produced a full-scale IQ score of 106 which falls at the 66 percentile and is in the average range.  While this is slightly below predicted levels for global functioning there is great consistency between a wide range of cognitive abilities on various composite score measures with the patient's lowest achieved composite score being information processing speed of 100.  While this performance is below predicted levels all achieved composite score groupings were within a total of 8 points.  We also calculated the patient's general ability score which produced a general abilities index score of 105 and fell at the 63rd percentile.  This measure places less emphasis on information processing speed and working memory which typically are the most variable or affected by acute changes.  Given the fact that the patient is working memory was his highest level of performance in information processing speed was his lowest level of performance they essentially canceled each other out allowing for extremely consistent scores between full-scale IQ score and general abilities index scores.  The patient produced average performance relative to a normative population for verbal  comprehension, perceptual reasoning and visual-spatial abilities, working memory and processing speed composite scores.          Verbal Comprehension Subtests Summary   Subtest Raw Score Scaled Score Percentile Rank Reference Group Scaled Score SEM  Similarities 32 14 91 14 1.04  Vocabulary 44 11 63 13 0.73  Information 0.73    The patient produced a verbal comprehension index score of 105 which falls at the 63rd percentile and is in the average range.  This is slightly below predicted levels based on historical variables including education occupational history but are still within the average range relative to a normative population.  There was some variability within various subtest performances.  The patient performed very well on measures of verbal reasoning and problem-solving with performances following at the 91st percentile relative to a normative population.  The patient performed in the average range with regard to vocabulary knowledge and skill and performed at the bottom end of the average range with regard to his general fund of information.         Perceptual Reasoning Subtests Summary   Subtest Raw Score Scaled Score Percentile Rank Reference Group Scaled Score SEM  Block Design 49 12 75 11 0.95  Matrix Reasoning 17 10 50 9 0.95  Visual  Puzzles 15 11 63 10 0.85    The patient produced a perceptual reasoning index score of 105 which falls at the 63rd percentile and is in the average range and only slightly below predicted levels.  There was little to no variability within subtest performance and the patient performed in the average to upper end of the average range relative to a normative population across all subtest.  These include measures of visual analysis and organization, visual reasoning and problem solving measures and visual estimation and judgment abilities.         Working Scientist, research (life sciences) Raw Score Scaled Score Percentile Rank  Reference Group Scaled Score SEM  Digit Span 32 12 75 12 0.73  Arithmetic 16 11 63 11 0.90    The patient produced a working memory index score of 108 which falls at the [employer] and is in the average range.  This is only slightly below predicted levels and was in fact his best area of performance relative to his own scores.  This working memory composite is heavily affected or influenced by auditory encoding abilities.  On pure auditory encoding and processing measures the patient produced formed in the high average range relative to a normative population falling at the 75th percentile.  The patient produced consistent scores on the arithmetic subtest which requires manipulation and processing of initially encoded auditory information.  There were no deficits identified with regard to working memory/auditory encoding variables.         Working Memory Process Score Summary   Process Score Raw Score Scaled Score Percentile Rank Base Rate SEM  Digit Span Forward 12 12 75 -- 1.24  Digit Span Backward 10 11 63 -- 1.12  Digit Span Sequencing 10 12 75 -- 1.27  Longest Digit Span Forward 7 -- -- 61.5 --  Longest Digit Span Backward 5 -- -- 55.5 --  Longest Digit Span Sequence 7 -- -- 23.0 --            Processing Speed Subtests Summary   Subtest Raw Score Scaled Score Percentile Rank Reference Group Scaled Score SEM  Symbol Search 33 11 63 10 1.56  Coding 60 9 37 8 1.20    The patient produced a processing speed index score of 100 which falls at the 50th percentile and is in the average range.  This score does not suggest any significant deficits with regard to visual scanning, visual searching of her overall speed of mental operations in the patient's individual subtest performances were generally consistent with 1 another.  These measures were particularly sensitive to impact significant concussive events can produce when postconcussion/residual neurological impact of concussive  events persist over time.          Index Score Summary   Index Sum of Scaled Scores Index Score Percentile Rank 95% Confidence Interval Qualitative Descriptor  Auditory Memory (AMI) 44 105 63 99-111 Average  Visual Memory (VMI) 48 112 79 106-117 High Average  Visual Working Memory (VWMI) 17 91 27 84-99 Average  Immediate Memory (IMI) 44 107 68 100-113 Average  Delayed Memory (DMI) 48 114 82 106-120 High Average    The patient was also administered the Wechsler Memory Scale-IV to obtain an objective standardized assessment of a broad range of memory and learning components.  While the patient's auditory encoding as assessed by the Wechsler Adult Intelligence Scale's were within normal limits and in fact one of his best areas of performance the patient's visual working memory  produced a visual working memory index score of 91 which fell at the 27th percentile and was the area of his lowest performance.  This measure is essentially assessing visual encoding capacity.  However, great caution should be made in over interpreting this score as it was noted during the test procedures that the patient lost instructional set (i.e. started applying different strategies or forgot instructions for test performance) best producing greater errors then is likely his capacity could have could have achieved.  Due to standardization procedures adjustments could not be made without breaking standard protocols.  Therefore, I do not think that this test performance is indicative of objective visual encoding deficits other than loss that and a brief episode of attentional lapse.  Breaking the patient's memory components down between auditory versus visual memory functions the patient produced an auditory memory index score of 105 which falls at the 63rd percentile and is in the average range.  This performance is consistent with global cognitive functioning and does not indicate any objective findings of auditory  memory deficits.  The patient produced a visual memory index score of 112 which falls at the 79th percentile and is in the high average range.  This is excellent performance with regard to visual memory and is in fact 1 of his best area memory and learning.  This performance strongly suggest that assumptions made about the invalidity or inaccuracy of his visual working memory component being due to lost instructional set rather than deficits with regard to visual working memory are correct as significant deficits with working memory would have a disproportionate impact on visual memory index performance.  Breaking the patient's memory components down between immediate memory and delayed memory the patient's performance continue to be within normal limits.  Immediate memory index score of 107 falls at the 68th percentile and is in the upper end of the average range.  The patient produced a delayed memory index score of 114 which falls at the high average range and again is quite good and consistent with predicted levels of overall cognitive performance.  The information that is encoded stored and organized remains available for later recall and there were no indications of memory or learning deficits noted across variables of encoding, storage and organization and recall and retrieval of either verbal or visual information.  Below you will find individual subtest performance that all are in the average to high average range with the exception of his spatial edition component which is part of this visual encoding composite and represented one individual subtest performance (spatial addition).        Primary Subtest Scaled Score Summary   Subtest Domain Raw Score Scaled Score Percentile Rank  Logical Memory I AM 30 12 75  Logical Memory II AM 25 12 75  Verbal Paired Associates I AM 28 9 37  Verbal Paired Associates II AM 11 11 63  Designs I VM 69 10 50  Designs II VM 53 10 50  Visual Reproduction I VM 41  13 84  Visual Reproduction II VM 38 15 95  Spatial Addition VWM Symbol Span VWM 24 10 50          Auditory Memory Process Score Summary   Process Score Raw Score Scaled Score Percentile Rank Cumulative Percentage (Base Rate)  LM II Recognition 25 - - 51-75%  VPA II Recognition 39 - - 51-75%          Visual Memory Process Score Summary  Process Score Raw Score Scaled Score Percentile Rank Cumulative Percentage (Base Rate)  DE I Content 35 10 50 -  DE I Spatial 16 9 37 -  DE II Content 35 10 50 -  DE II Spatial 10 8 25  -  DE II Recognition 15 - - 51-75%  VR II Recognition 7 - - >75%    ABILITY-MEMORY ANALYSIS  Ability Score:    GAI: 105 Date of Testing:           WAIS-IV; WMS-IV 2020/02/01  Predicted Difference Method   Index Predicted WMS-IV Index Score Actual WMS-IV Index Score Difference Critical Value  Significant Difference Y/N Base Rate  Auditory Memory 103 105 -2 9.38 N   Visual Memory 103 112 -9 9.36 N   Visual Working Memory 103 91 12 11.05 Y 15%  Immediate Memory 103 107 -4 10.29 N   Delayed Memory 103 114 -11 10.13 Y 20%  Statistical significance (critical value) at the .01 level.    We also utilized the ability-memory analysis for assessment of memory functioning.  In this analysis a predicted score is reduced based on the patient's general abilities index score of 105 and then this predicted score is compared statistically against the actual achieved score on various memory indices from the Wechsler Memory Scale's.  With the exception of the patient's visual working memory index score, which is assumed to of been spoiled due to 1 individual subtest measure all of his memory performances were equal to or better than predicted levels.  In fact, the patient did particularly well relative to predicted levels for visual memory which highlights the likelihood of his visual working memory index score being an accurate to the actual visual  encoding capacity.  Again there were no objective indications of memory or learning deficits outside of potential memory components of increased distractibility and "lost set" capacity related to attention and concentration.  If these attentional variables are overcome, the patient's memory and learning beyond issues of distractibility are quite good.   Summary of Results:   Overall, the objective results derived during this neuropsychological test battery suggest that overall the patient is doing well cognitively and there were no indications or objective findings of significant cognitive or neuropsychological deficits beyond those of episodic periods of inattention and distractibility.  The patient appeared to be able to sustain attention well and also appeared to be able to encode both auditory and visual information quite well.  Global cognitive functioning is quite consistent with various composite scores and there is great consistency across a broad range of cognitive domains.  While these were slightly below predicted levels based on his educational and occupational history they were quite consistent.  There were slight relative weaknesses with regard to information processing speed but his overall information processing speed including visual scanning and visual searching capacities were still within the normal range relative to a normative population.  The patient's memory and learning was also quite good and there were no objective findings of memory or learning deficits and in fact the patient's visual memory was in the high average range and clearly consistent with historical/premorbid estimations.  Impression/Diagnosis:   Overall, the results of the current neuropsychological evaluation are quite encouraging.  There are no significant indications of cognitive deficits across a broad range of cognitive and intellectual domains beyond those of episodic periods of inattention and distractibility.   While there may be some mild relative weaknesses with regard to information processing speed variables even these are  within normal limits.  The patient's verbal comprehension, visual perceptual reasoning and problem solving, auditory encoding and likely visual encoding capacities are all within normative expectations.  The patient's learning and memory including encoding capacity, storage and organization capacity and retrieval of learned information are all quite good without indication of deficits.  The only indication of difficulties noted on the current testing were strictly confined to discrete areas of distractibility consistent with previous diagnoses of attentional deficits.  While his history makes it very difficult to differentiate between the possibilities of adult residual attention deficit disorder versus impacts of concussive events during his teen years this is the only area of deficits.  The patient has been a good responder to psychostimulant medications in the past with no apparent side effects.  He did not respond well to SSRI type medications around his attentional issues.  His good response to psychostimulant medications is not a reliable diagnostic indicator of whether these longstanding weaknesses are related to a diagnosis of attention deficit disorder versus residual mild effects of multiple prior concussive events as both of these diagnostic categories have been shown to respond well to psychostimulants.  From a diagnostic consideration there does not appear to be anything suggesting cortical or subcortical involvement in his difficulties and his acute symptoms that developed recently would be much better explained by the development of a myelopathy in spinal cord regions rather than central brain region changes.  It is very likely that the patient's subjective experiences of memory difficulties are a combination of indirect secondary impacts of his changes in motor function and pain  producing focus/distress around these issues combining with underlying/longstanding attentional deficits versus cortically or subcortically mediated memory loss.  I will sit down with the patient and reviewed the results of the current objective neuropsychological assessment as well as make a copy of this report available in his EMR for easy access to his referring physician.  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 Clinical Neuropsychologist

## 2020-03-30 ENCOUNTER — Encounter: Payer: BC Managed Care – PPO | Attending: Psychology | Admitting: Psychology

## 2020-03-30 ENCOUNTER — Other Ambulatory Visit: Payer: Self-pay

## 2020-03-30 DIAGNOSIS — F908 Attention-deficit hyperactivity disorder, other type: Secondary | ICD-10-CM | POA: Diagnosis not present

## 2020-03-30 DIAGNOSIS — G959 Disease of spinal cord, unspecified: Secondary | ICD-10-CM | POA: Diagnosis not present

## 2020-03-30 DIAGNOSIS — T8062XA Other serum reaction due to vaccination, initial encounter: Secondary | ICD-10-CM | POA: Diagnosis not present

## 2020-04-02 ENCOUNTER — Encounter: Payer: Self-pay | Admitting: Psychology

## 2020-04-02 NOTE — Progress Notes (Signed)
03/30/2020: 8 AM to 9 AM:  Today's visit was an in person visit with the patient myself present.  The patient I reviewed the results of the recent neuropsychological evaluation and we went over the diagnostic implications and recommendations going forward regarding residual issues.  The patient's complete neuropsychological evaluation can be found in his EMR dated 03/02/2020.  The results of the evaluation are quite encouraging from a cognitive standpoint and the patient appears to have improved significantly cognitively.  He continues to have some struggles.    Summary of Results:                        Overall, the objective results derived during this neuropsychological test battery suggest that overall the patient is doing well cognitively and there were no indications or objective findings of significant cognitive or neuropsychological deficits beyond those of episodic periods of inattention and distractibility.  The patient appeared to be able to sustain attention well and also appeared to be able to encode both auditory and visual information quite well.  Global cognitive functioning is quite consistent with various composite scores and there is great consistency across a broad range of cognitive domains.  While these were slightly below predicted levels based on his educational and occupational history they were quite consistent.  There were slight relative weaknesses with regard to information processing speed but his overall information processing speed including visual scanning and visual searching capacities were still within the normal range relative to a normative population.  The patient's memory and learning was also quite good and there were no objective findings of memory or learning deficits and in fact the patient's visual memory was in the high average range and clearly consistent with historical/premorbid estimations.  Impression/Diagnosis:                     Overall, the results of the  current neuropsychological evaluation are quite encouraging.  There are no significant indications of cognitive deficits across a broad range of cognitive and intellectual domains beyond those of episodic periods of inattention and distractibility.  While there may be some mild relative weaknesses with regard to information processing speed variables even these are within normal limits.  The patient's verbal comprehension, visual perceptual reasoning and problem solving, auditory encoding and likely visual encoding capacities are all within normative expectations.  The patient's learning and memory including encoding capacity, storage and organization capacity and retrieval of learned information are all quite good without indication of deficits.  The only indication of difficulties noted on the current testing were strictly confined to discrete areas of distractibility consistent with previous diagnoses of attentional deficits.  While his history makes it very difficult to differentiate between the possibilities of adult residual attention deficit disorder versus impacts of concussive events during his teen years this is the only area of deficits.  The patient has been a good responder to psychostimulant medications in the past with no apparent side effects.  He did not respond well to SSRI type medications around his attentional issues.  His good response to psychostimulant medications is not a reliable diagnostic indicator of whether these longstanding weaknesses are related to a diagnosis of attention deficit disorder versus residual mild effects of multiple prior concussive events as both of these diagnostic categories have been shown to respond well to psychostimulants.  From a diagnostic consideration there does not appear to be anything suggesting cortical or subcortical involvement in his difficulties and  his acute symptoms that developed recently would be much better explained by the development of a  myelopathy in spinal cord regions rather than central brain region changes.  It is very likely that the patient's subjective experiences of memory difficulties are a combination of indirect secondary impacts of his changes in motor function and pain producing focus/distress around these issues combining with underlying/longstanding attentional deficits versus cortically or subcortically mediated memory loss.  I will sit down with the patient and reviewed the results of the current objective neuropsychological assessment as well as make a copy of this report available in his EMR for easy access to his referring physician.  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

## 2020-04-04 ENCOUNTER — Ambulatory Visit: Payer: BC Managed Care – PPO | Admitting: Neurology

## 2020-04-04 ENCOUNTER — Encounter: Payer: Self-pay | Admitting: Neurology

## 2020-04-04 VITALS — BP 127/86 | HR 90 | Ht 68.0 in | Wt 245.0 lb

## 2020-04-04 DIAGNOSIS — R4189 Other symptoms and signs involving cognitive functions and awareness: Secondary | ICD-10-CM

## 2020-04-04 DIAGNOSIS — R2990 Unspecified symptoms and signs involving the nervous system: Secondary | ICD-10-CM | POA: Diagnosis not present

## 2020-04-04 NOTE — Progress Notes (Signed)
[facility]    Provider:  Dr Lucia Gaskins Requesting Provider: No ref. provider found Primary Care Provider:  Mila Homer  CC:  Paresthesias  04/04/2020: Formal neurocognitive testing did not show anything concerning for neurodegenerative disease. He is feeling much better. Patient is improved. His memory testing is unremarkable, normal for age, tremors have improved as have many of his other symptoms including the tingling in his fingers and toes, nothing consistent or serious and much improved since the vaccine, severity and frequency have decreased, neuro exam is non focal. No tremors on exam seen. No ataxia or dysmetria.   Personally reviewed imaging and agree: MRI brain and cervical spine   IMPRESSION:   MRI cervical spine (with and without) demonstrating: - At C3-4 disc bulging and uncovertebral joint hypertrophy with severe right and mild left foraminal stenosis; borderline mild spinal stenosis. - At C6-7 disc bulging and facet hypertrophy with mild left foraminal stenosis.   IMPRESSION:   Unremarkable MRI brain (with and without). Mild chronic sinusitis changes. No acute findings.  HPI:  Rickey Stafford is a 55 y.o. male here as requested by No ref. provider found for neuropathy.  Past medical history of thoracolumbar decompression for myelopathy.  I reviewed Dr. Shon Baton notes.  He was seen at [facility] June 25 and reported 2 months of tremors right upper extremity, difficulty eating, dysesthesias into the arms hands and feet bilaterally after having the [employer] vaccination for Covid.  He reported he lost approximately 20 pounds, at that time his symptoms were slowly improving but he continued to have mild to moderate neck pain that radiated into the right upper extremity and numbness and dysesthesias in a stocking glove distribution.  He reported no history of diabetes or peripheral neuropathy.  Imaging studies demonstrated loss of cervical  lordosis but no acute fracture or abnormality, his clinical exam demonstrated no signs of symptoms of cervical or thoracic myelopathy, he did have a history of previous lower thoracolumbar decompression for myelopathy which he recovered from.  He had vaccine 3/17 March by that weekend couldn't eat cereal or soup due to severe arm tremors, When he flexes he feels a shock down his spine. SInce the vaccine he has had tremors, paresthesias in the body, what sound Lheurmitte's, also tinglin gin the hands in feet and down the spine with neck flexion. Severe shock, electric, brief, he still has tremors and difficulty with fine motor if fatigued. He lost a lot weight, that is improved, he is feelig better but still symptomatic, no vision changes, no problems with speech, also residual sensory loss in the hands and feet even without head flexion. No Fhx of dementia. No alcohol use. No smoking.   Last 5 years also with "brain fog", memory changes, he denies any sleep apnea, he has a hx of concussions,  He may watch the same movie over again and forget parts. He feels it is aging. He is on Adderall, he can;t remember people's names, he performs all his ADLs and IADLs. 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 He does not need it every day. But it helps focus.   Reviewed notes, labs and imaging from outside physicians, which showed: see above  Reviewed xr 02/2016 thoracolumbar: agree with findings, reviewed images:  FINDINGS: Fluoroscopy time 2 minutes 11 seconds. Two nondiagnostic spot fluoroscopic intraoperative radiographs demonstrate postsurgical changes from bilateral posterior spinal fusion at T11-12.  IMPRESSION: Intraoperative fluoroscopic guidance for bilateral posterior  spinal fusion at T11-12.  Review of Systems: Patient complains of symptoms per HPI as well as the following symptoms: paresthesias . Pertinent negatives and positives per HPI. All  others negative.   Social History   Socioeconomic History  . Marital status: Single    Spouse name: Not on file  . Number of children: Not on file  . Years of education: Not on file  . Highest education level: Not on file  Occupational History  . Not on file  Tobacco Use  . Smoking status: Never Smoker  . Smokeless tobacco: Current User    Types: Snuff  Substance and Sexual Activity  . Alcohol use: Not Currently    Alcohol/week: 3.0 - 4.0 standard drinks    Types: 3 - 4 Cans of beer per week  . Drug use: No  . Sexual activity: Not on file  Other Topics Concern  . Not on file  Social History Narrative   Lives alone    Caffeine: 2 soft drinks/day   Right handed   Social Determinants of Health   Financial Resource Strain: Not on file  Food Insecurity: Not on file  Transportation Needs: Not on file  Physical Activity: Not on file  Stress: Not on file  Social Connections: Not on file  Intimate Partner Violence: Not on file    Family History  Problem Relation Age of Onset  . Other Mother        brain stem tumor that affected hearing   . Autoimmune disease Neg Hx     Past Medical History:  Diagnosis Date  . Arthritis   . GERD (gastroesophageal reflux disease)   . History of kidney stones     Patient Active Problem List   Diagnosis Date Noted  . Myelopathy (HCC) 03/06/2016    Past Surgical History:  Procedure Laterality Date  . ACHILLES TENDON SURGERY Right 2004  . CYST EXCISION Right 2011   foot  . LUMBAR LAMINECTOMY/DECOMPRESSION MICRODISCECTOMY N/A 03/06/2016   Procedure: T11-12 Decompression and fusion;  Surgeon: Venita Lick, MD;  Location: [facility] OR;  Service: Orthopedics;  Laterality: N/A;  Requests 4 hours    Current Outpatient Medications  Medication Sig Dispense Refill  . ADDERALL XR 20 MG 24 hr capsule Take 20 mg by mouth daily.  0  . sildenafil (REVATIO) 20 MG tablet Take 20-100 mg by mouth daily as needed (for erectile dysfunction). TAKE ONE TO  FIVE TABLETS AS DIRECTED BY PHYSICIAN. DO NOT TAKE MORE THAN 5 TABS IN ANY 24 HOUR PERIOD.    Marland Kitchen meloxicam (MOBIC) 15 MG tablet Take 15 mg by mouth daily. (Patient not taking: No sig reported)  2   No current facility-administered medications for this visit.    Allergies as of 04/04/2020 - Review Complete 04/04/2020  Allergen Reaction Noted  . No known allergies  03/05/2016    Vitals: BP 127/86 (BP Location: Right Arm, Patient Position: Sitting)   Pulse 90   Ht 5\' 8"  (1.727 m)   Wt 245 lb (111.1 kg)   BMI 37.25 kg/m  Last Weight:  Wt Readings from Last 1 Encounters:  04/04/20 245 lb (111.1 kg)   Last Height:   Ht Readings from Last 1 Encounters:  04/04/20 5\' 8"  (1.727 m)     Physical exam: Exam: Gen: NAD, conversant, well nourised, obese, well groomed                     CV: RRR, no MRG. No Carotid Bruits.  No peripheral edema, warm, nontender Eyes: Conjunctivae clear without exudates or hemorrhage  Neuro: Detailed Neurologic Exam  Speech:    Speech is normal; fluent and spontaneous with normal comprehension.  Cognition:    The patient is oriented to person, place, and time;     recent and remote memory intact;     language fluent;     normal attention, concentration,     fund of knowledge Cranial Nerves:    The pupils are equal, round, and reactive to light. The fundi are flat. Visual fields are full to finger confrontation. Extraocular movements are intact. Trigeminal sensation is intact and the muscles of mastication are normal. The face is symmetric. The palate elevates in the midline. Hearing intact. Voice is normal. Shoulder shrug is normal. The tongue has normal motion without fasciculations.   Coordination:    No ataxia or dysmetria  Gait:    Heel-toe and tandem gait   Motor Observation:    No asymmetry, no atrophy, and no involuntary movements noted. Tone:    Normal muscle tone.    Posture:    Posture is normal. normal erect    Strength:     Strength is V/V in the upper and lower limbs.      .    Assessment/Plan: This is a lovely 55 year old gentleman who had the Covid vaccine and then developed a constellation of symptoms below the cervical spine including numbness, tingling, incoordination, ataxia and a tremors.    - MRI of the brain and cervical spine were unremarkable, no significant pathology found. In the absence of any other finding to explain his symptoms, the only thing that is left is possibly side effects to vaccine luckily patient's symptoms have resolved 95% per patient. .  - Formal neurocognitive testing did not show anything concerning for neurodegenerative disease. He is feeling much better. Patient is improved. His memory testing is unremarkable, normal for age, tremors have improved as have many of his other symptoms including the tingling in his fingers and toes, nothing consistent or serious and much improved, severity and frequency have decreased, neuro exam is normal..      No orders of the defined types were placed in this encounter.  No orders of the defined types were placed in this encounter.   Cc: No ref. provider found,  John Nangum Sanford,[place]  Naomie Dean, MD  [facility] [address] [place], [place] [address]  Phone [phone] Fax [phone]  I spent 30 minutes of face-to-face and non-face-to-face time with patient on the  1. Subjective cognitive impairment   2. Multiple neurological symptoms    diagnosis.  This included previsit chart review, lab review, study review, order entry, electronic health record documentation, patient education on the different diagnostic and therapeutic options, counseling and coordination of care, risks and benefits of management, compliance, or risk factor reduction

## 2020-04-04 NOTE — Patient Instructions (Addendum)
This is a lovely 55 year old gentleman who had the Covid vaccine and then developed a constellation of symptoms below the cervical spine including numbness, tingling, incoordination, ataxia and a tremors.    - MRI of the brain and cervical spine were unremarkable, no significant pathology found. In the absence of any other finding to explain his symptoms, the only thing that is left is possibly side effects to vaccine luckily patient's symptoms have resolved 95% per patient. .  - Formal neurocognitive testing did not show anything concerning for neurodegenerative disease. He is feeling much better. Patient is improved. His memory testing is unremarkable, normal for age, tremors have improved as have many of his other symptoms including the tingling in his fingers and toes, nothing consistent or serious and much improved, severity and frequency have decreased, neuro exam is normal..   Naomie Dean, MD
